# Patient Record
Sex: Female | Born: 1945
Health system: Southern US, Community
[De-identification: ages and names within clinical notes are randomized; demographics above are authoritative.]

## PROBLEM LIST (undated history)

## (undated) DIAGNOSIS — E785 Hyperlipidemia, unspecified: Secondary | ICD-10-CM

## (undated) HISTORY — DX: Hyperlipidemia, unspecified: E78.5

## (undated) HISTORY — PX: INCONTINENCE SURGERY: SHX676

## (undated) HISTORY — PX: BREAST CYST EXCISION: SHX579

## (undated) HISTORY — PX: ABDOMINAL HYSTERECTOMY: SHX81

## (undated) HISTORY — PX: OVARIAN CYST REMOVAL: SHX89

## (undated) HISTORY — PX: APPENDECTOMY: SHX54

---

## 1998-05-16 ENCOUNTER — Other Ambulatory Visit: Admission: RE | Admit: 1998-05-16 | Discharge: 1998-05-16 | Payer: Self-pay | Admitting: Obstetrics and Gynecology

## 1998-05-20 ENCOUNTER — Ambulatory Visit (HOSPITAL_BASED_OUTPATIENT_CLINIC_OR_DEPARTMENT_OTHER): Admission: RE | Admit: 1998-05-20 | Discharge: 1998-05-20 | Payer: Self-pay

## 1998-11-03 ENCOUNTER — Other Ambulatory Visit: Admission: RE | Admit: 1998-11-03 | Discharge: 1998-11-03 | Payer: Self-pay | Admitting: Obstetrics and Gynecology

## 2000-09-27 ENCOUNTER — Encounter: Payer: Self-pay | Admitting: Obstetrics and Gynecology

## 2000-09-27 ENCOUNTER — Encounter: Admission: RE | Admit: 2000-09-27 | Discharge: 2000-09-27 | Payer: Self-pay | Admitting: Obstetrics and Gynecology

## 2001-04-30 ENCOUNTER — Ambulatory Visit (HOSPITAL_COMMUNITY): Admission: RE | Admit: 2001-04-30 | Discharge: 2001-04-30 | Payer: Self-pay | Admitting: Gastroenterology

## 2001-05-23 ENCOUNTER — Other Ambulatory Visit: Admission: RE | Admit: 2001-05-23 | Discharge: 2001-05-23 | Payer: Self-pay | Admitting: Obstetrics and Gynecology

## 2002-10-01 ENCOUNTER — Other Ambulatory Visit: Admission: RE | Admit: 2002-10-01 | Discharge: 2002-10-01 | Payer: Self-pay | Admitting: Obstetrics and Gynecology

## 2003-10-05 ENCOUNTER — Encounter: Admission: RE | Admit: 2003-10-05 | Discharge: 2003-10-05 | Payer: Self-pay | Admitting: Obstetrics and Gynecology

## 2003-10-05 ENCOUNTER — Encounter: Payer: Self-pay | Admitting: Obstetrics and Gynecology

## 2004-01-06 ENCOUNTER — Other Ambulatory Visit: Admission: RE | Admit: 2004-01-06 | Discharge: 2004-01-06 | Payer: Self-pay | Admitting: Obstetrics and Gynecology

## 2004-11-14 ENCOUNTER — Encounter: Admission: RE | Admit: 2004-11-14 | Discharge: 2004-11-14 | Payer: Self-pay | Admitting: Obstetrics and Gynecology

## 2005-02-23 ENCOUNTER — Other Ambulatory Visit: Admission: RE | Admit: 2005-02-23 | Discharge: 2005-02-23 | Payer: Self-pay | Admitting: Obstetrics and Gynecology

## 2005-10-04 ENCOUNTER — Ambulatory Visit: Payer: Self-pay | Admitting: Internal Medicine

## 2007-02-26 ENCOUNTER — Ambulatory Visit: Payer: Self-pay | Admitting: Internal Medicine

## 2007-02-26 LAB — CONVERTED CEMR LAB
ALT: 18 units/L (ref 0–40)
AST: 19 units/L (ref 0–37)
Albumin: 3.9 g/dL (ref 3.5–5.2)
Alkaline Phosphatase: 61 units/L (ref 39–117)
BUN: 14 mg/dL (ref 6–23)
Bacteria, UA: NEGATIVE
Basophils Absolute: 0.1 10*3/uL (ref 0.0–0.1)
Basophils Relative: 1.9 % — ABNORMAL HIGH (ref 0.0–1.0)
Bilirubin Urine: NEGATIVE
Bilirubin, Direct: 0.1 mg/dL (ref 0.0–0.3)
CO2: 29 meq/L (ref 19–32)
Calcium: 9.1 mg/dL (ref 8.4–10.5)
Chloride: 110 meq/L (ref 96–112)
Cholesterol: 232 mg/dL (ref 0–200)
Creatinine, Ser: 0.8 mg/dL (ref 0.4–1.2)
Crystals: NEGATIVE
Direct LDL: 151 mg/dL
Eosinophils Absolute: 0 10*3/uL (ref 0.0–0.6)
Eosinophils Relative: 0.5 % (ref 0.0–5.0)
GFR calc Af Amer: 95 mL/min
GFR calc non Af Amer: 78 mL/min
Glucose, Bld: 98 mg/dL (ref 70–99)
HCT: 41.6 % (ref 36.0–46.0)
HDL: 58.9 mg/dL (ref >39.0)
Hemoglobin, Urine: NEGATIVE
Hemoglobin: 14.3 g/dL (ref 12.0–15.0)
Ketones, ur: NEGATIVE mg/dL
Lymphocytes Relative: 41.4 % (ref 12.0–46.0)
MCHC: 34.3 g/dL (ref 30.0–36.0)
MCV: 90.2 fL (ref 78.0–100.0)
Monocytes Absolute: 0.3 10*3/uL (ref 0.2–0.7)
Monocytes Relative: 5.3 % (ref 3.0–11.0)
Neutro Abs: 2.4 10*3/uL (ref 1.4–7.7)
Neutrophils Relative %: 50.9 % (ref 43.0–77.0)
Nitrite: NEGATIVE
Platelets: 272 10*3/uL (ref 150–400)
Potassium: 4.4 meq/L (ref 3.5–5.1)
RBC: 4.61 M/uL (ref 3.87–5.11)
RDW: 12.2 % (ref 11.5–14.6)
Sodium: 145 meq/L (ref 135–145)
Specific Gravity, Urine: >=1.03 (ref 1.000–1.03)
TSH: 0.84 microintl units/mL (ref 0.35–5.50)
Total Bilirubin: 0.9 mg/dL (ref 0.3–1.2)
Total CHOL/HDL Ratio: 3.9
Total Protein, Urine: NEGATIVE mg/dL
Total Protein: 6.8 g/dL (ref 6.0–8.3)
Triglycerides: 66 mg/dL (ref 0–149)
Urine Glucose: NEGATIVE mg/dL
Urobilinogen, UA: 0.2 (ref 0.0–1.0)
VLDL: 13 mg/dL (ref 0–40)
WBC: 4.8 10*3/uL (ref 4.5–10.5)
pH: 5.5 (ref 5.0–8.0)

## 2007-03-03 ENCOUNTER — Ambulatory Visit: Payer: Self-pay | Admitting: Internal Medicine

## 2008-05-10 ENCOUNTER — Ambulatory Visit: Payer: Self-pay | Admitting: Internal Medicine

## 2008-05-10 DIAGNOSIS — M25519 Pain in unspecified shoulder: Secondary | ICD-10-CM | POA: Insufficient documentation

## 2008-05-10 DIAGNOSIS — G4700 Insomnia, unspecified: Secondary | ICD-10-CM | POA: Insufficient documentation

## 2008-11-24 ENCOUNTER — Encounter: Payer: Self-pay | Admitting: Internal Medicine

## 2008-12-01 ENCOUNTER — Ambulatory Visit: Payer: Self-pay | Admitting: Internal Medicine

## 2008-12-01 DIAGNOSIS — Z860100 Personal history of colon polyps, unspecified: Secondary | ICD-10-CM | POA: Insufficient documentation

## 2008-12-01 DIAGNOSIS — E559 Vitamin D deficiency, unspecified: Secondary | ICD-10-CM | POA: Insufficient documentation

## 2008-12-03 DIAGNOSIS — E785 Hyperlipidemia, unspecified: Secondary | ICD-10-CM | POA: Insufficient documentation

## 2008-12-03 LAB — CONVERTED CEMR LAB: Vit D, 1,25-Dihydroxy: 9 — ABNORMAL LOW (ref 30–89)

## 2008-12-06 LAB — CONVERTED CEMR LAB
ALT: 19 units/L (ref 0–35)
AST: 21 units/L (ref 0–37)
Albumin: 3.9 g/dL (ref 3.5–5.2)
Alkaline Phosphatase: 70 units/L (ref 39–117)
BUN: 14 mg/dL (ref 6–23)
Basophils Absolute: 0 10*3/uL (ref 0.0–0.1)
Basophils Relative: 0.7 % (ref 0.0–3.0)
Bilirubin Urine: NEGATIVE
Bilirubin, Direct: 0.1 mg/dL (ref 0.0–0.3)
CO2: 30 meq/L (ref 19–32)
Calcium: 9.2 mg/dL (ref 8.4–10.5)
Chloride: 108 meq/L (ref 96–112)
Cholesterol: 256 mg/dL (ref 0–200)
Creatinine, Ser: 0.8 mg/dL (ref 0.4–1.2)
Direct LDL: 181.1 mg/dL
Eosinophils Absolute: 0 10*3/uL (ref 0.0–0.7)
Eosinophils Relative: 0.8 % (ref 0.0–5.0)
GFR calc Af Amer: 94 mL/min
GFR calc non Af Amer: 78 mL/min
Glucose, Bld: 95 mg/dL (ref 70–99)
HCT: 42.4 % (ref 36.0–46.0)
HDL: 59.6 mg/dL (ref >39.0)
Hemoglobin, Urine: NEGATIVE
Hemoglobin: 14.5 g/dL (ref 12.0–15.0)
Ketones, ur: NEGATIVE mg/dL
Leukocytes, UA: NEGATIVE
Lymphocytes Relative: 36 % (ref 12.0–46.0)
MCHC: 34.3 g/dL (ref 30.0–36.0)
MCV: 88.7 fL (ref 78.0–100.0)
Monocytes Absolute: 0.4 10*3/uL (ref 0.1–1.0)
Monocytes Relative: 7.4 % (ref 3.0–12.0)
Neutro Abs: 2.8 10*3/uL (ref 1.4–7.7)
Neutrophils Relative %: 55.1 % (ref 43.0–77.0)
Nitrite: NEGATIVE
Platelets: 231 10*3/uL (ref 150–400)
Potassium: 4.2 meq/L (ref 3.5–5.1)
RBC: 4.79 M/uL (ref 3.87–5.11)
RDW: 11.9 % (ref 11.5–14.6)
Sodium: 143 meq/L (ref 135–145)
Specific Gravity, Urine: <=1.005 (ref 1.000–1.03)
TSH: 0.88 microintl units/mL (ref 0.35–5.50)
Total Bilirubin: 1 mg/dL (ref 0.3–1.2)
Total CHOL/HDL Ratio: 4.3
Total Protein, Urine: NEGATIVE mg/dL
Total Protein: 7.3 g/dL (ref 6.0–8.3)
Triglycerides: 66 mg/dL (ref 0–149)
Urine Glucose: NEGATIVE mg/dL
Urobilinogen, UA: 1 (ref 0.0–1.0)
VLDL: 13 mg/dL (ref 0–40)
WBC: 5 10*3/uL (ref 4.5–10.5)
pH: 6 (ref 5.0–8.0)

## 2009-01-14 ENCOUNTER — Ambulatory Visit: Payer: Self-pay | Admitting: Internal Medicine

## 2009-01-14 DIAGNOSIS — H6690 Otitis media, unspecified, unspecified ear: Secondary | ICD-10-CM | POA: Insufficient documentation

## 2009-01-17 ENCOUNTER — Telehealth (INDEPENDENT_AMBULATORY_CARE_PROVIDER_SITE_OTHER): Payer: Self-pay | Admitting: *Deleted

## 2009-05-27 ENCOUNTER — Telehealth: Payer: Self-pay | Admitting: Internal Medicine

## 2009-09-27 ENCOUNTER — Ambulatory Visit: Payer: Self-pay | Admitting: Internal Medicine

## 2009-09-27 DIAGNOSIS — T7020XA Unspecified effects of high altitude, initial encounter: Secondary | ICD-10-CM | POA: Insufficient documentation

## 2010-04-06 ENCOUNTER — Telehealth (INDEPENDENT_AMBULATORY_CARE_PROVIDER_SITE_OTHER): Payer: Self-pay | Admitting: *Deleted

## 2010-11-20 ENCOUNTER — Telehealth: Payer: Self-pay | Admitting: Internal Medicine

## 2010-11-29 ENCOUNTER — Encounter (INDEPENDENT_AMBULATORY_CARE_PROVIDER_SITE_OTHER): Payer: Self-pay | Admitting: *Deleted

## 2010-11-29 ENCOUNTER — Telehealth: Payer: Self-pay | Admitting: Internal Medicine

## 2011-01-25 NOTE — Letter (Signed)
Summary: Primary Care Appointment Letter  [facility]  [address] [place], [place] [address]   Phone: [phone]  Fax: [phone]    11/29/2010 MRN: [id number]  Olivia Odom [address] [place], [place]  [address]  Dear [name],   Your Primary Care Physician Tresa Garter MD has indicated that:    ___XX____it is time to schedule an appointment for follow up.    _______you missed your appointment on______ and need to call and          reschedule.    _______you need to have lab work done.    _______you need to schedule an appointment discuss lab or test results.    _______you need to call to reschedule your appointment that is                       scheduled on _________.     Please call our office as soon as possible. Our phone number is [phone] option #1. Please press option 1. Our office is open 8a-12noon and 1p-5p, Monday through Friday.     Thank you,    [facility]

## 2011-01-25 NOTE — Progress Notes (Signed)
Summary: Rf Ambien  Phone Note Refill Request Message from:  Fax from Pharmacy  Refills Requested: Medication #1:  ZOLPIDEM TARTRATE 10 MG  TABS 1/2 or 1 by mouth at hs prn   Dosage confirmed as above?Dosage Confirmed   Supply Requested: 90   Last Refilled: 09/17/2010  Method Requested: Telephone to Pharmacy Next Appointment Scheduled: none Initial call taken by: Lanier Prude, [facility]),  November 20, 2010 10:55 AM  Follow-up for Phone Call        ok 3 ref Follow-up by: Tresa Garter MD,  November 20, 2010 5:51 PM    Prescriptions: ZOLPIDEM TARTRATE 10 MG  TABS (ZOLPIDEM TARTRATE) 1/2 or 1 by mouth at hs prn  #90 x 1   Entered by:   Lamar Sprinkles, CMA   Authorized by:   Tresa Garter MD   Signed by:   Lamar Sprinkles, CMA on 11/20/2010   Method used:   Telephoned to ...       [facility] [address] [phone]* (retail)       [address]       [place], [place]  [address]       Ph: [phone] or [phone]       Fax: [phone]   RxID:   [id number]

## 2011-01-25 NOTE — Progress Notes (Signed)
Summary: Zolpidem refill  Phone Note Refill Request Message from:  Fax from Pharmacy on April 06, 2010 11:28 AM  Refills Requested: Medication #1:  ZOLPIDEM TARTRATE 10 MG  TABS 1/2 or 1 by mouth at hs prn Next Appointment Scheduled: none Initial call taken by: Lucious Groves,  April 06, 2010 11:28 AM  Follow-up for Phone Call        ok x 6    Prescriptions: ZOLPIDEM TARTRATE 10 MG  TABS (ZOLPIDEM TARTRATE) 1/2 or 1 by mouth at hs prn  #90 x 6   Entered by:   Ami Bullins CMA   Authorized by:   Tresa Garter MD   Signed by:   Bill Salinas CMA on 04/06/2010   Method used:   Telephoned to ...       [facility] [address] [phone]* (retail)       [address]       [place], [place]  [address]       Ph: [phone] or [phone]       Fax: [phone]   RxID:   [id number]

## 2011-01-25 NOTE — Progress Notes (Signed)
Summary: Needs OV with PCP  ---- Converted from flag ---- ---- 11/29/2010 10:46 AM, Verdell Face wrote: mailed a letter after calls..cd    ---- 11/20/2010 10:56 AM, Lanier Prude, CMA(AAMA) wrote: Please sched pt OV with PCP. Last seen 09/2009.  Thanks! ------------------------------

## 2011-05-11 NOTE — Assessment & Plan Note (Signed)
[facility]                           PRIMARY CARE OFFICE NOTE   NAME:Olivia Odom, Olivia Odom                      MRN:          [id number]  DATE:03/03/2007                            DOB:          [birth year]    The patient, a 65 year old female, presents for wellness examination.   ALLERGIES:  None.   Past medical history, family history, and social history as per  August 30, 2004.   CURRENT MEDICINES:  None.   REVIEW OF SYSTEMS:  There is no chest pain or shortness of breath, no  abdominal complaints.  The rest of the review of systems is negative.   PHYSICAL EXAMINATION:  VITAL SIGNS:  Blood pressure 118/74, pulse 68,  temp 98.6, weight 241 pounds.  GENERAL:  She looks well, younger than her stated age.  HEENT:  Moist mucosa.  Wax impaction bilaterally.  NECK:  Supple, no thyromegaly or bruit.  LUNGS: Clear to auscultation and percussion, no wheeze or rales.  HEART:  S1 and S2, no murmur, no gallop.  ABDOMEN:  Soft and nontender, no organomegaly is felt.  EXTREMITIES:  Lower extremities without edema.  NEUROLOGIC:  She is alert and cooperative, not depressed.  SKIN:  Clear.   LABORATORY DATA:  On February 26, 2007:  WBC normal, CMET normal,  cholesterol 232, LDL 151, HDL 58.9, TSH 0.84, urinalysis normal.   ASSESSMENT AND PLAN:  1. Normal wellness examination.  Age/health related issues discussed,      her cholesterol discussed, continue regular joint care with Dr.      Tenny Craw, obtain bone density scan, and advised to take calcium and      vitamin D.  2. Left ear - dermatitis.  Triamcinolone 0.5% cream to use daily      p.r.n.  3. Wax impaction bilaterally.  Will irrigate.   PROCEDURE:  Ear irrigation.   INDICATION:  Wax impaction.  The risks and benefits explained to  patient.  Both ears were irrigated with lukewarm water.  A large amount  of wax recovered from both sides.  Tolerated well.   COMPLICATIONS:  None.     Georgina Quint.  Plotnikov, MD  Electronically Signed    AVP/MedQ  DD: 03/06/2007  DT: 03/07/2007  Job #: [id number]   cc:   Miguel Aschoff, M.D.

## 2011-05-28 ENCOUNTER — Ambulatory Visit (INDEPENDENT_AMBULATORY_CARE_PROVIDER_SITE_OTHER): Payer: BC Managed Care – PPO | Admitting: Internal Medicine

## 2011-05-28 ENCOUNTER — Encounter: Payer: Self-pay | Admitting: Internal Medicine

## 2011-05-28 VITALS — BP 130/78 | HR 80 | Temp 98.2°F | Resp 16 | Ht 62.0 in | Wt 135.0 lb

## 2011-05-28 DIAGNOSIS — Z23 Encounter for immunization: Secondary | ICD-10-CM

## 2011-05-28 DIAGNOSIS — Z0000 Encounter for general adult medical examination without abnormal findings: Secondary | ICD-10-CM | POA: Insufficient documentation

## 2011-05-28 DIAGNOSIS — G4700 Insomnia, unspecified: Secondary | ICD-10-CM

## 2011-05-28 DIAGNOSIS — H109 Unspecified conjunctivitis: Secondary | ICD-10-CM | POA: Insufficient documentation

## 2011-05-28 DIAGNOSIS — T7020XA Unspecified effects of high altitude, initial encounter: Secondary | ICD-10-CM

## 2011-05-28 DIAGNOSIS — H10029 Other mucopurulent conjunctivitis, unspecified eye: Secondary | ICD-10-CM

## 2011-05-28 MED ORDER — TETANUS-DIPHTH-ACELL PERTUSSIS 5-2.5-18.5 LF-MCG/0.5 IM SUSP
0.5000 mL | Freq: Once | INTRAMUSCULAR | Status: AC
  Administered 2011-05-28: 0.5 mL via INTRAMUSCULAR

## 2011-05-28 MED ORDER — DEXAMETHASONE 4 MG PO TABS: ORAL_TABLET | ORAL | Status: DC

## 2011-05-28 MED ORDER — SULFACETAMIDE-PREDNISOLONE 10-0.23 % OP SOLN: 1.0000 [drp] | Freq: Four times a day (QID) | OPHTHALMIC | Status: AC

## 2011-05-28 MED ORDER — OSELTAMIVIR PHOSPHATE 75 MG PO CAPS: 75.0000 mg | ORAL_CAPSULE | Freq: Two times a day (BID) | ORAL | Status: DC

## 2011-05-28 MED ORDER — TOBRAMYCIN-DEXAMETHASONE 0.3-0.1 % OP SUSP: 1.0000 [drp] | Freq: Four times a day (QID) | OPHTHALMIC | Status: AC

## 2011-05-28 MED ORDER — ZOLPIDEM TARTRATE 10 MG PO TABS: 5.0000 mg | ORAL_TABLET | Freq: Every evening | ORAL | Status: DC | PRN

## 2011-05-28 NOTE — Progress Notes (Signed)
  Subjective:    Patient ID: Olivia Odom, female    DOB: 10/29/1946, 65 y.o.   MRN: [id number]  HPI   The patient is here for a wellness exam. The patient has been doing well overall without major physical or psychological issues going on lately.  C/o L eye - pink x 2 d after a visit to [employer]   Review of Systems  Constitutional: Negative.  Negative for fever, chills, diaphoresis, activity change, appetite change, fatigue and unexpected weight change.  HENT: Negative for hearing loss, ear pain, nosebleeds, congestion, sore throat, facial swelling, rhinorrhea, sneezing, mouth sores, trouble swallowing, neck pain, neck stiffness, postnasal drip, sinus pressure and tinnitus.   Eyes: Positive for pain, discharge, itching and visual disturbance. Negative for redness (L eye).  Respiratory: Negative for cough, chest tightness, shortness of breath, wheezing and stridor.   Cardiovascular: Negative for chest pain, palpitations and leg swelling.  Gastrointestinal: Negative for nausea, diarrhea, constipation, blood in stool, abdominal distention, anal bleeding and rectal pain.  Genitourinary: Negative for dysuria, urgency, frequency, hematuria, flank pain, vaginal bleeding, vaginal discharge, difficulty urinating, genital sores, menstrual problem and pelvic pain.  Musculoskeletal: Negative for back pain, joint swelling, arthralgias and gait problem.  Skin: Negative.  Negative for rash.  Neurological: Negative for dizziness, tremors, seizures, syncope, speech difficulty, weakness, numbness and headaches.  Hematological: Negative for adenopathy. Does not bruise/bleed easily.  Psychiatric/Behavioral: Negative for suicidal ideas, behavioral problems, sleep disturbance, dysphoric mood and decreased concentration. The patient is not nervous/anxious.        Objective:   Physical Exam  Constitutional: She appears well-developed and well-nourished. No distress.  HENT:  Head: Normocephalic.    Right Ear: External ear normal.  Left Ear: External ear normal.  Nose: Nose normal.  Mouth/Throat: Oropharynx is clear and moist.  Eyes: Pupils are equal, round, and reactive to light. Right eye exhibits no discharge. Left eye exhibits discharge.       L conj w/erythema. No ulceratiom. Nl pressure by palpation  Neck: Normal range of motion. Neck supple. No JVD present. No tracheal deviation present. No thyromegaly present.  Cardiovascular: Normal rate, regular rhythm and normal heart sounds.   Pulmonary/Chest: No stridor. No respiratory distress. She has no wheezes.  Abdominal: Soft. Bowel sounds are normal. She exhibits no distension and no mass. There is no tenderness. There is no rebound and no guarding.  Musculoskeletal: She exhibits no edema and no tenderness.  Lymphadenopathy:    She has no cervical adenopathy.  Neurological: She displays normal reflexes. No cranial nerve deficit. She exhibits normal muscle tone. Coordination normal.  Skin: No rash noted. She is not diaphoretic. No erythema.  Psychiatric: She has a normal mood and affect. Her behavior is normal. Judgment and thought content normal.   L eye       Assessment & Plan:

## 2011-05-28 NOTE — Assessment & Plan Note (Signed)
See Rx OV w/Dr Nile Riggs if not better in 2-3 d

## 2011-05-28 NOTE — Assessment & Plan Note (Signed)
On rx prn

## 2011-05-28 NOTE — Progress Notes (Signed)
Addended by: Vernie Murders on: 05/28/2011 12:25 PM   Modules accepted: Orders

## 2011-05-28 NOTE — Assessment & Plan Note (Addendum)
Will try Dexamethasone prn Info given  Potential benefits of a short term steroid  use as well as potential risks  and complications were explained to the patient and were aknowledged.

## 2011-05-28 NOTE — Progress Notes (Signed)
Addended by: Merrilyn Puma on: 05/28/2011 04:58 PM   Modules accepted: Orders

## 2011-05-28 NOTE — Assessment & Plan Note (Signed)
We discussed age appropriate health related issues, including available/recomended screening tests and vaccinations. We discussed a need for adhering to healthy diet and exercise. Labs were ordered. All questions were answered. GYN/pap/mammo/BDS - per Dr Tenny Craw

## 2011-06-01 ENCOUNTER — Other Ambulatory Visit: Payer: Self-pay

## 2011-06-01 ENCOUNTER — Other Ambulatory Visit: Payer: Self-pay | Admitting: Internal Medicine

## 2011-06-01 DIAGNOSIS — Z0000 Encounter for general adult medical examination without abnormal findings: Secondary | ICD-10-CM

## 2011-06-04 ENCOUNTER — Ambulatory Visit: Payer: Self-pay

## 2011-06-04 ENCOUNTER — Encounter: Payer: Self-pay | Admitting: Internal Medicine

## 2011-06-05 ENCOUNTER — Other Ambulatory Visit: Payer: Self-pay | Admitting: Internal Medicine

## 2011-06-05 ENCOUNTER — Other Ambulatory Visit (INDEPENDENT_AMBULATORY_CARE_PROVIDER_SITE_OTHER): Payer: BC Managed Care – PPO | Admitting: Internal Medicine

## 2011-06-05 ENCOUNTER — Other Ambulatory Visit (INDEPENDENT_AMBULATORY_CARE_PROVIDER_SITE_OTHER): Payer: BC Managed Care – PPO

## 2011-06-05 DIAGNOSIS — Z0000 Encounter for general adult medical examination without abnormal findings: Secondary | ICD-10-CM

## 2011-06-05 LAB — CBC WITH DIFFERENTIAL/PLATELET
Basophils Relative: 0.7 % (ref 0.0–3.0)
Eosinophils Absolute: 0 10*3/uL (ref 0.0–0.7)
HCT: 41.5 % (ref 36.0–46.0)
Hemoglobin: 14.4 g/dL (ref 12.0–15.0)
Lymphocytes Relative: 40.9 % (ref 12.0–46.0)
Lymphs Abs: 2.1 10*3/uL (ref 0.7–4.0)
MCHC: 34.6 g/dL (ref 30.0–36.0)
MCV: 92.6 fl (ref 78.0–100.0)
Neutro Abs: 2.6 10*3/uL (ref 1.4–7.7)
RBC: 4.48 Mil/uL (ref 3.87–5.11)

## 2011-06-05 LAB — BASIC METABOLIC PANEL
BUN: 16 mg/dL (ref 6–23)
CO2: 27 mEq/L (ref 19–32)
Chloride: 108 mEq/L (ref 96–112)
Creatinine, Ser: 0.9 mg/dL (ref 0.4–1.2)

## 2011-06-05 LAB — HEPATIC FUNCTION PANEL
AST: 21 U/L (ref 0–37)
Albumin: 4.3 g/dL (ref 3.5–5.2)
Alkaline Phosphatase: 72 U/L (ref 39–117)
Bilirubin, Direct: 0.1 mg/dL (ref 0.0–0.3)
Total Bilirubin: 0.6 mg/dL (ref 0.3–1.2)

## 2011-06-05 LAB — URINALYSIS, ROUTINE W REFLEX MICROSCOPIC
Nitrite: NEGATIVE
Specific Gravity, Urine: >=1.03 (ref 1.000–1.030)
Urobilinogen, UA: 0.2 (ref 0.0–1.0)
pH: 5.5 (ref 5.0–8.0)

## 2011-06-05 LAB — LIPID PANEL
Total CHOL/HDL Ratio: 3
VLDL: 11.2 mg/dL (ref 0.0–40.0)

## 2011-06-06 ENCOUNTER — Telehealth: Payer: Self-pay | Admitting: Internal Medicine

## 2011-06-06 DIAGNOSIS — E785 Hyperlipidemia, unspecified: Secondary | ICD-10-CM

## 2011-06-06 NOTE — Telephone Encounter (Signed)
Olivia Odom, please, inform patient that all labs are normal except for elev chol  Please, mail the labs to the patient.

## 2011-06-07 NOTE — Telephone Encounter (Signed)
Pt informed. Copies mailed.  She wants to know what to do for elevated chol. Please advise

## 2011-06-08 NOTE — Telephone Encounter (Signed)
We can start chol lowering med with a marginal risk reduction of heart disease.  She can take a  Baby asa a day. I would eat well, exercise, loose 4 lbs ( a soft call) and try to take fish oil. We can recheck lipids in 6 mo and ROV w/results Thx

## 2011-06-11 ENCOUNTER — Telehealth: Payer: Self-pay | Admitting: Internal Medicine

## 2011-06-11 NOTE — Telephone Encounter (Signed)
Pt informed

## 2011-06-11 NOTE — Telephone Encounter (Signed)
Sonda Primes, MD 06/08/2011 5:45 PM Signed  We can start chol lowering med with a marginal risk reduction of heart disease.  She can take a Baby asa a day.  I would eat well, exercise, loose 4 lbs ( a soft call) and try to take fish oil.  We can recheck lipids in 6 mo and ROV w/results  Thx

## 2011-06-11 NOTE — Telephone Encounter (Signed)
Pt informed. She will let us know about starting chol med. And f/u in 6 mo

## 2012-01-09 ENCOUNTER — Ambulatory Visit: Payer: BC Managed Care – PPO

## 2012-01-09 DIAGNOSIS — E785 Hyperlipidemia, unspecified: Secondary | ICD-10-CM

## 2012-01-09 LAB — LIPID PANEL
Total CHOL/HDL Ratio: 4
Triglycerides: 85 mg/dL (ref 0.0–149.0)

## 2012-01-09 LAB — LDL CHOLESTEROL, DIRECT: Direct LDL: 160.7 mg/dL

## 2012-01-10 ENCOUNTER — Telehealth: Payer: Self-pay | Admitting: *Deleted

## 2012-01-10 NOTE — Telephone Encounter (Signed)
Rf req for Zolpidem 10 mg 1/2-1 po qhs. Ok to Rf? 

## 2012-01-14 ENCOUNTER — Telehealth: Payer: Self-pay

## 2012-01-14 DIAGNOSIS — Z0000 Encounter for general adult medical examination without abnormal findings: Secondary | ICD-10-CM

## 2012-01-14 MED ORDER — ZOLPIDEM TARTRATE 10 MG PO TABS: 5.0000 mg | ORAL_TABLET | Freq: Every evening | ORAL | Status: DC | PRN

## 2012-01-14 NOTE — Telephone Encounter (Signed)
Patient would like recent lab results. Please call her cell number at [phone]

## 2012-01-14 NOTE — Telephone Encounter (Signed)
Please advise in AVP's absence. Thanks!

## 2012-01-14 NOTE — Telephone Encounter (Signed)
sure

## 2012-01-15 ENCOUNTER — Encounter: Payer: Self-pay | Admitting: Endocrinology

## 2012-01-15 ENCOUNTER — Ambulatory Visit (INDEPENDENT_AMBULATORY_CARE_PROVIDER_SITE_OTHER): Payer: BC Managed Care – PPO | Admitting: Endocrinology

## 2012-01-15 VITALS — BP 136/92 | HR 86 | Temp 97.9°F | Ht 62.0 in | Wt 140.0 lb

## 2012-01-15 DIAGNOSIS — J069 Acute upper respiratory infection, unspecified: Secondary | ICD-10-CM

## 2012-01-15 MED ORDER — AZITHROMYCIN 500 MG PO TABS: 500.0000 mg | ORAL_TABLET | Freq: Every day | ORAL | Status: AC

## 2012-01-15 MED ORDER — AZITHROMYCIN 500 MG PO TABS: 500.0000 mg | ORAL_TABLET | Freq: Every day | ORAL | Status: DC

## 2012-01-15 NOTE — Progress Notes (Signed)
  Subjective:    Patient ID: Olivia Odom, female    DOB: 1946-02-01, 66 y.o.   MRN: [id number]  HPI Pt states 2 weeks of slight right otalgia, and assoc sore throat No past medical history on file.  Past Surgical History  Procedure Date  . Breast cyst excision     college    History   Social History  . Marital Status: Married    Spouse Name: N/A    Number of Children: N/A  . Years of Education: N/A   Occupational History  . Not on file.   Social History Main Topics  . Smoking status: Never Smoker   . Smokeless tobacco: Not on file  . Alcohol Use: 0.6 oz/week    1 Glasses of wine per week  . Drug Use: No  . Sexually Active: Yes   Other Topics Concern  . Not on file   Social History Narrative  . No narrative on file    Current Outpatient Prescriptions on File Prior to Visit  Medication Sig Dispense Refill  . cholecalciferol (VITAMIN D) 1000 UNITS tablet Take 1,000 Units by mouth daily.        Marland Kitchen dexamethasone (DECADRON) 4 MG tablet 1 po qd 1 day prior to ascend and qd pc x 2-3 days  21 tablet  0  . Omega-3 Fatty Acids (FISH OIL) 1000 MG CAPS Take 1 each by mouth 2 (two) times daily.        Marland Kitchen zolpidem (AMBIEN) 10 MG tablet Take 0.5-1 tablets (5-10 mg total) by mouth at bedtime as needed.  30 tablet  0    No Known Allergies  Family History  Problem Relation Age of Onset  . Cancer Mother 14    breast cancer  . Heart disease Father 28    MI    BP 136/92  Pulse 86  Temp(Src) 97.9 F (36.6 C) (Oral)  Ht 5\' 2"  (1.575 m)  Wt 140 lb (63.504 kg)  BMI 25.61 kg/m2  SpO2 97%    Review of Systems She has nasal congestion, but no fever.  No cough    Objective:   Physical Exam VITAL SIGNS:  See vs page GENERAL: no distress head: no deformity eyes: no periorbital swelling, no proptosis external nose and ears are normal mouth: no lesion seen Right tm is red.  Left is normal NECK: There is no palpable thyroid enlargement.  No thyroid nodule is palpable.   No palpable lymphadenopathy at the anterior neck. LUNGS:  Clear to auscultation         Assessment & Plan:

## 2012-01-15 NOTE — Patient Instructions (Addendum)
Here is a prescription, for an antibiotic.   Loratadine-d (non-prescription) will help your congestion.  I hope you feel better soon.

## 2012-01-17 NOTE — Telephone Encounter (Signed)
Please advise- does pt need to change anything re: abnormal lipids?

## 2012-01-18 NOTE — Telephone Encounter (Signed)
Chol is better than before: good cholesterol is very high. Keep ROV or sch well exam w/labs in 6 mo Cont fish oil, good diet  Please, mail the labs to the patient.    Thx

## 2012-01-18 NOTE — Telephone Encounter (Signed)
Pt informed/transferred to scheduler. CPE labs entered.

## 2012-01-20 NOTE — Progress Notes (Signed)
  Subjective:    Patient ID: Olivia Odom, female    DOB: 1946-10-21, 66 y.o.   MRN: [id number]  HPI    Review of Systems     Objective:   Physical Exam        Assessment & Plan:  URI, new

## 2012-03-28 ENCOUNTER — Telehealth: Payer: Self-pay | Admitting: *Deleted

## 2012-03-28 NOTE — Telephone Encounter (Signed)
Rf req for Zolpidem 10 mg 1/2-1 po qhs. Ok to Rf? 

## 2012-03-30 NOTE — Telephone Encounter (Signed)
OK to fill this prescription with additional refills x5 Thank you!  

## 2012-03-31 MED ORDER — ZOLPIDEM TARTRATE 10 MG PO TABS: 5.0000 mg | ORAL_TABLET | Freq: Every evening | ORAL | Status: DC | PRN

## 2012-03-31 NOTE — Telephone Encounter (Signed)
Done

## 2012-07-02 ENCOUNTER — Encounter: Payer: BC Managed Care – PPO | Admitting: Internal Medicine

## 2012-08-11 ENCOUNTER — Encounter: Payer: Self-pay | Admitting: Internal Medicine

## 2012-08-11 ENCOUNTER — Ambulatory Visit (INDEPENDENT_AMBULATORY_CARE_PROVIDER_SITE_OTHER): Payer: BC Managed Care – PPO | Admitting: Internal Medicine

## 2012-08-11 VITALS — BP 130/88 | HR 69 | Temp 97.5°F | Resp 16 | Wt 135.0 lb

## 2012-08-11 DIAGNOSIS — T783XXA Angioneurotic edema, initial encounter: Secondary | ICD-10-CM

## 2012-08-11 MED ORDER — DOXEPIN HCL 10 MG PO CAPS: 10.0000 mg | ORAL_CAPSULE | Freq: Every day | ORAL | Status: DC

## 2012-08-11 NOTE — Progress Notes (Signed)
Subjective:    Patient ID: Olivia Odom, female    DOB: 08/27/1946, 66 y.o.   MRN: [id number]  Rash This is a recurrent problem. The current episode started in the past 7 days. The problem has been gradually improving since onset. The affected locations include the lips. The rash is characterized by itchiness and swelling. She was exposed to nothing. Associated symptoms include a sore throat. Pertinent negatives include no anorexia, congestion, cough, diarrhea, eye pain, facial edema, fatigue, fever, joint pain, nail changes, rhinorrhea, shortness of breath or vomiting. Past treatments include nothing.      Review of Systems  Constitutional: Negative for fever, chills, diaphoresis, activity change, appetite change, fatigue and unexpected weight change.  HENT: Positive for sore throat. Negative for hearing loss, ear pain, nosebleeds, congestion, facial swelling, rhinorrhea, sneezing, drooling, mouth sores, trouble swallowing, neck pain, neck stiffness, dental problem, voice change, postnasal drip, sinus pressure, tinnitus and ear discharge.   Eyes: Negative.  Negative for pain.  Respiratory: Negative for cough, chest tightness, shortness of breath, wheezing and stridor.   Cardiovascular: Negative for chest pain, palpitations and leg swelling.  Gastrointestinal: Negative.  Negative for vomiting, diarrhea and anorexia.  Genitourinary: Negative.   Musculoskeletal: Negative for joint pain.  Skin: Positive for rash. Negative for nail changes, color change, pallor and wound.  Neurological: Negative.   Hematological: Negative for adenopathy. Does not bruise/bleed easily.  Psychiatric/Behavioral: Negative.        Objective:   Physical Exam  Vitals reviewed. Constitutional: She is oriented to person, place, and time. She appears well-developed and well-nourished.  Non-toxic appearance. She does not have a sickly appearance. She does not appear ill. No distress.  HENT:  Head: Normocephalic and  atraumatic. No trismus in the jaw.    Right Ear: Hearing, tympanic membrane, external ear and ear canal normal.  Left Ear: Hearing, tympanic membrane, external ear and ear canal normal.  Mouth/Throat: Mucous membranes are normal. Mucous membranes are not pale, not dry and not cyanotic. No oral lesions. No uvula swelling. Posterior oropharyngeal erythema present. No oropharyngeal exudate, posterior oropharyngeal edema or tonsillar abscesses.       Her lips are mildly and diffusely swollen  Eyes: Conjunctivae are normal. Right eye exhibits no discharge. Left eye exhibits no discharge. No scleral icterus.  Neck: Normal range of motion. Neck supple. No JVD present. No tracheal deviation present. No thyromegaly present.  Cardiovascular: Normal rate, regular rhythm, normal heart sounds and intact distal pulses.  Exam reveals no gallop and no friction rub.   No murmur heard. Pulmonary/Chest: Effort normal and breath sounds normal. No stridor. No respiratory distress. She has no wheezes. She has no rales. She exhibits no tenderness.  Abdominal: Soft. Bowel sounds are normal. She exhibits no distension and no mass. There is no tenderness. There is no rebound and no guarding.  Musculoskeletal: Normal range of motion. She exhibits no edema and no tenderness.  Lymphadenopathy:    She has no cervical adenopathy.  Neurological: She is oriented to person, place, and time.  Skin: Skin is warm and dry. No rash noted. She is not diaphoretic. No erythema. No pallor.  Psychiatric: She has a normal mood and affect. Her behavior is normal. Judgment and thought content normal.      Lab Results  Component Value Date   WBC 5.1 06/05/2011   HGB 14.4 06/05/2011   HCT 41.5 06/05/2011   PLT 239.0 06/05/2011   GLUCOSE 97 06/05/2011   CHOL 244* 01/09/2012  TRIG 85.0 01/09/2012   HDL 64.00 01/09/2012   LDLDIRECT 160.7 01/09/2012   LDLCALC DEL 12/01/2008   ALT 20 06/05/2011   AST 21 06/05/2011   NA 140 06/05/2011   K 4.5  06/05/2011   CL 108 06/05/2011   CREATININE 0.9 06/05/2011   BUN 16 06/05/2011   CO2 27 06/05/2011   TSH 0.86 06/05/2011      Assessment & Plan:

## 2012-08-11 NOTE — Patient Instructions (Signed)
Angioedema Angioedema (AE) is a sudden swelling of the eyelids, lips, lobes of ears, external genitalia, skin, and other parts of the body. AE can happen by itself. It usually begins during the night and is found on awakening. It can happen with hives and other allergic reactions. Attacks can be mild and annoying, or life-threatening if the air passages swell. AE generally occurs in a short time period (over minutes to hours) and gets better in 24 to 48 hours. It usually does not cause any serious problems.  There are 2 different kinds of AE:   Allergic AE.   Nonallergic AE.   There may be an overreaction or direct stimulation of cells that are a part of the immune system (mast cells).   There may be problems with the release of chemicals made by the body that cause swelling and inflammation (kinins). AE due to kinins can be inherited from parents (hereditary), or it can develop on its own (acquired). Acquired AE either shows up before, or along with, certain diseases or is due to the body's immune system attacking parts of the body's own cells (autoimmune).  CAUSES  Allergic  AE due to allergic reactions are caused by something that causes the body to react (trigger). Common triggers include:   Foods.   Medicines.   Latex.   Direct contact with certain fruits, vegetables, or animal saliva.   Insect stings.  Nonallergic  Mast cell stimulation may be caused by:   Medicines.   Dyes used in X-rays.   The body's own immune system reactions to parts of the body (autoimmune disease).   Possibly, some virus infections.   AE due to problems with kinins can be hereditary or acquired. Attacks are triggered by:   Mild injury.   Dental work or any surgery.   Stress.   Sudden changes in temperature.   Exercise.   Medicines.   AE due to problems with kinins can also be due to certain medicines, especially blood pressure medicines like angiotensin-converting enzyme (ACE)  inhibitors. African Americans are at nearly 5 times greater risk of developing AE than Caucasians from ACE inhibitors.  SYMPTOMS  Allergic symptoms:  Non-itchy swelling of the skin. Often the swelling is on the face and lips, but any area of the skin can swell. Sometimes, the swelling can be painful. If hives are present, there is intense itching.   Breathing problems if the air passages swell.  Nonallergic symptoms:  If internal organs are involved, there may be:   Nausea.   Abdominal pain.   Vomiting.   Difficulty swallowing.   Difficulty passing urine.   Breathing problems if the air passages swell.  Depending on the cause of AE, episodes may:  Only happen once (if triggers are removed or avoided).   Come back in unpredictable patterns.   Repeat for several years and then gradually fade away.  DIAGNOSIS  AE is diagnosed by:   Asking questions to find out how fast the symptoms began.   Taking a family history.   Physical exam.   Diagnostic tests. Tests could include:   Allergy skin tests to see if the problem is allergic.   Blood tests to diagnose hereditary and some acquired types of AE.   Other tests to see if there is a hidden disease leading to the AE.  TREATMENT  Treatment depends on the type and cause (if any) of the AE. Allergic  Allergic types of AE are treated with:   Immediate removal of   the trigger or medicine (if any).   Epinephrine injection.   Steroids.   Antihistamines.   Hospitalization for severe attacks.  Nonallergic  Mast cell stimulation types of AE are treated with:   Immediate removal of the trigger or medicine (if any).   Epinephrine injection.   Steroids.   Antihistamines.   Hospitalization for severe attacks.   Hereditary AE is treated with:   Medicines to prevent and treat attacks. There is little response to antihistamines, epinephrine, or steroids.   Preventive medicines before dental work or surgery.    Removing or avoiding medicines that trigger attacks.   Hospitalization for severe attacks.   Acquired AE is treated with:   Treating underlying disease (if any).   Medicines to prevent and treat attacks.  HOME CARE INSTRUCTIONS   Always carry your emergency allergy treatment medicines with you.   Wear a medical bracelet.   Avoid known triggers.  SEEK MEDICAL CARE IF:   You get repeat attacks.   Your attacks are more frequent or more severe despite preventive measures.   You have hereditary AE and are considering having children. It is important to discuss the risks of passing this on to your children.  SEEK IMMEDIATE MEDICAL CARE IF:   You have difficulty breathing.   You have difficulty swallowing.   You experience fainting.  This condition should be treated immediately. It can be life-threatening if it involves throat swelling. Document Released: 02/18/2002 Document Revised: 11/29/2011 Document Reviewed: 12/09/2008 Exit[employer] Patient Information 2012 [employer]. 

## 2012-08-11 NOTE — Assessment & Plan Note (Signed)
She has viral pharyngitis and angioedema of the lips, she does not want to take steroids, she will treat with doxepin at bedtime

## 2012-09-12 ENCOUNTER — Encounter: Payer: Self-pay | Admitting: Internal Medicine

## 2012-09-12 ENCOUNTER — Other Ambulatory Visit: Payer: BC Managed Care – PPO

## 2012-09-12 ENCOUNTER — Telehealth: Payer: Self-pay | Admitting: Internal Medicine

## 2012-09-12 ENCOUNTER — Ambulatory Visit (INDEPENDENT_AMBULATORY_CARE_PROVIDER_SITE_OTHER): Payer: BC Managed Care – PPO | Admitting: Internal Medicine

## 2012-09-12 VITALS — BP 130/82 | HR 84 | Temp 99.0°F | Resp 16 | Wt 135.4 lb

## 2012-09-12 DIAGNOSIS — T783XXA Angioneurotic edema, initial encounter: Secondary | ICD-10-CM

## 2012-09-12 DIAGNOSIS — L309 Dermatitis, unspecified: Secondary | ICD-10-CM

## 2012-09-12 DIAGNOSIS — L259 Unspecified contact dermatitis, unspecified cause: Secondary | ICD-10-CM

## 2012-09-12 DIAGNOSIS — Z23 Encounter for immunization: Secondary | ICD-10-CM

## 2012-09-12 MED ORDER — PSEUDOEPHEDRINE HCL ER 120 MG PO TB12: 120.0000 mg | ORAL_TABLET | Freq: Two times a day (BID) | ORAL | Status: DC | PRN

## 2012-09-12 MED ORDER — METHYLPREDNISOLONE ACETATE 80 MG/ML IJ SUSP
120.0000 mg | Freq: Once | INTRAMUSCULAR | Status: AC
  Administered 2012-09-12: 120 mg via INTRAMUSCULAR

## 2012-09-12 MED ORDER — LORATADINE 10 MG PO TABS: 10.0000 mg | ORAL_TABLET | Freq: Every day | ORAL | Status: DC

## 2012-09-12 MED ORDER — PREDNISONE 10 MG PO TABS: ORAL_TABLET | ORAL | Status: DC

## 2012-09-12 MED ORDER — TRIAMCINOLONE ACETONIDE 0.5 % EX OINT: TOPICAL_OINTMENT | Freq: Three times a day (TID) | CUTANEOUS | Status: DC

## 2012-09-12 NOTE — Telephone Encounter (Signed)
Pt is requesting to be seen today and you do not have slots----Pt sore throat is not better and she refuse to see another md---Can she be worked in today on your--

## 2012-09-12 NOTE — Assessment & Plan Note (Signed)
Depomedrol IM  Claritin 10 mg/d Sudafed

## 2012-09-12 NOTE — Assessment & Plan Note (Signed)
9/13 lips - refractory ?etiology Triamcinolone oint tid-qid  If worse: Prednisone 10 mg: take 4 tabs a day x 3 days; then 3 tabs a day x 4 days; then 2 tabs a day x 4 days, then 1 tab a day x 6 days, then stop. Take pc.

## 2012-09-12 NOTE — Progress Notes (Signed)
  Subjective:    Patient ID: Olivia Odom, female    DOB: 1946/02/07, 66 y.o.   MRN: [id number]  HPI  C/o lip swelling that started 6 wks ago - now cont to have raw lips and swelling. No fever, no mouth lesions. C/o some ST  Wt Readings from Last 3 Encounters:  09/12/12 135 lb 6 oz (61.406 kg)  08/11/12 135 lb (61.236 kg)  01/15/12 140 lb (63.504 kg)      Review of Systems  Constitutional: Negative for chills, activity change, appetite change, fatigue and unexpected weight change.  HENT: Positive for sore throat and facial swelling (lips). Negative for hearing loss, nosebleeds, congestion, mouth sores, trouble swallowing, neck stiffness, voice change and sinus pressure.   Eyes: Negative for visual disturbance.  Respiratory: Negative for cough and chest tightness.   Gastrointestinal: Negative for nausea and abdominal pain.  Genitourinary: Negative for frequency, difficulty urinating and vaginal pain.  Musculoskeletal: Negative for back pain and gait problem.  Skin: Negative for pallor and rash.  Neurological: Negative for dizziness, tremors, weakness, numbness and headaches.  Psychiatric/Behavioral: Negative for confusion and disturbed wake/sleep cycle.       Objective:   Physical Exam  Constitutional: She appears well-developed. No distress.  HENT:  Head: Normocephalic.  Right Ear: External ear normal.  Left Ear: External ear normal.  Nose: Nose normal.  Mouth/Throat: Oropharynx is clear and moist. No oropharyngeal exudate.       Throat WNL  Eyes: Conjunctivae normal are normal. Pupils are equal, round, and reactive to light. Right eye exhibits no discharge. Left eye exhibits no discharge.  Neck: Normal range of motion. Neck supple. No JVD present. No tracheal deviation present. No thyromegaly present.  Cardiovascular: Normal rate, regular rhythm and normal heart sounds.   Pulmonary/Chest: No stridor. No respiratory distress. She has no wheezes.  Abdominal: Soft. Bowel  sounds are normal. She exhibits no distension and no mass. There is no tenderness. There is no rebound and no guarding.  Musculoskeletal: She exhibits no edema and no tenderness.  Lymphadenopathy:    She has cervical adenopathy (mild, sensitive).  Neurological: She displays normal reflexes. No cranial nerve deficit. She exhibits normal muscle tone. Coordination normal.  Skin: Rash (lips are erythematous with erosions and some swelling) noted. No erythema.  Psychiatric: She has a normal mood and affect. Her behavior is normal. Judgment and thought content normal.          Assessment & Plan:

## 2012-09-12 NOTE — Telephone Encounter (Signed)
Ok to work in today Thx 

## 2012-09-16 LAB — ALLERGEN FOOD PROFILE SPECIFIC IGE
Apple: <0.1 kU/L
Egg White IgE: <0.1 kU/L
Milk IgE: <0.1 kU/L
Orange: <0.1 kU/L
Peanut IgE: <0.1 kU/L
Shrimp IgE: <0.1 kU/L
Tomato IgE: <0.1 kU/L
Tuna IgE: <0.1 kU/L

## 2012-09-17 ENCOUNTER — Telehealth: Payer: Self-pay | Admitting: Internal Medicine

## 2012-09-17 LAB — ALLERGY PROFILE REGION II-DC, DE, MD, ~~LOC~~, VA
Allergen, D pternoyssinus,d7: <0.1 kU/L
Alternaria Alternata: <0.1 kU/L
Aspergillus fumigatus, IgG: <0.1 kU/L
Box Elder IgE: <0.1 kU/L
Cladosporium Herbarum: <0.1 kU/L
Cockroach: <0.1 kU/L
IgE (Immunoglobulin E), Serum: 5.6 IU/mL (ref 0.0–180.0)
Johnson Grass: <0.1 kU/L
Pecan/Hickory Tree IgE: <0.1 kU/L

## 2012-09-17 NOTE — Telephone Encounter (Signed)
Olivia Odom, please, inform patient that her preliminary results show allergy to beef and eggs: please avoid them in diet. Is she better? Is she taking po prednisone? Thx

## 2012-09-18 NOTE — Telephone Encounter (Signed)
Pt informed. She is taking the prednisone and antihistamine. She states all the bumps are gone and she just has a little irritation. She states she had taken more than usual of  Ambien prior to her symptoms starting. Do you think she needs a different sleeping medication?

## 2012-09-19 NOTE — Telephone Encounter (Signed)
I do not think Ambien is a problem. More allergy tests keep coming back - she is very allergic to milk and wheat: please avoid all dairy and bread products for now. OK Gluten free bread, crackers.  Please, mail the labs to the patient.    Thx

## 2012-09-19 NOTE — Telephone Encounter (Signed)
Pt informed. Copies mailed 

## 2012-09-20 LAB — IGG FOOD PANEL
Chicken, IgG: <0.15 ug/mL (ref <0.15)
Corn, IgG: 0.2 ug/mL — ABNORMAL HIGH (ref <0.15)
Egg yolk, IgG: 10.1 ug/mL — ABNORMAL HIGH (ref <2.0)
Wheat, IgG: 13.2 ug/mL — ABNORMAL HIGH (ref <0.15)

## 2012-10-11 ENCOUNTER — Other Ambulatory Visit: Payer: Self-pay | Admitting: Internal Medicine

## 2012-10-13 NOTE — Telephone Encounter (Signed)
Ok to Rf? 

## 2012-10-15 ENCOUNTER — Telehealth: Payer: Self-pay | Admitting: *Deleted

## 2012-10-15 NOTE — Telephone Encounter (Signed)
Yes, she can see an allergist. Avoid food allergens (per lab report) x2 mo and see if better Thx

## 2012-10-15 NOTE — Telephone Encounter (Signed)
Pt informed

## 2012-10-15 NOTE — Telephone Encounter (Signed)
I called pt- she states she is taking the antihistamine qod. Her mouth ulcers are still present but it seems to be getting better. She wants to know if she should see an allergist.

## 2013-04-23 LAB — HM PAP SMEAR

## 2013-04-23 LAB — HM MAMMOGRAPHY

## 2013-05-07 ENCOUNTER — Other Ambulatory Visit: Payer: Self-pay | Admitting: Internal Medicine

## 2013-05-11 NOTE — Telephone Encounter (Signed)
Notified [facility] spoke with brandy gave approval...lmb

## 2013-06-04 ENCOUNTER — Ambulatory Visit (INDEPENDENT_AMBULATORY_CARE_PROVIDER_SITE_OTHER): Payer: BC Managed Care – PPO | Admitting: Internal Medicine

## 2013-06-04 ENCOUNTER — Encounter: Payer: Self-pay | Admitting: Internal Medicine

## 2013-06-04 VITALS — BP 128/82 | HR 81 | Temp 98.0°F | Ht 62.0 in | Wt 131.0 lb

## 2013-06-04 DIAGNOSIS — J309 Allergic rhinitis, unspecified: Secondary | ICD-10-CM

## 2013-06-04 NOTE — Patient Instructions (Signed)
Allergic Rhinitis Allergic rhinitis is when the mucous membranes in the nose respond to allergens. Allergens are particles in the air that cause your body to have an allergic reaction. This causes you to release allergic antibodies. Through a chain of events, these eventually cause you to release histamine into the blood stream (hence the use of antihistamines). Although meant to be protective to the body, it is this release that causes your discomfort, such as frequent sneezing, congestion and an itchy runny nose.  CAUSES  The pollen allergens may come from grasses, trees, and weeds. This is seasonal allergic rhinitis, or "hay fever." Other allergens cause year-round allergic rhinitis (perennial allergic rhinitis) such as house dust mite allergen, pet dander and mold spores.  SYMPTOMS   Nasal stuffiness (congestion).  Runny, itchy nose with sneezing and tearing of the eyes.  There is often an itching of the mouth, eyes and ears. It cannot be cured, but it can be controlled with medications. DIAGNOSIS  If you are unable to determine the offending allergen, skin or blood testing may find it. TREATMENT   Avoid the allergen.  Medications and allergy shots (immunotherapy) can help.  Hay fever may often be treated with antihistamines in pill or nasal spray forms. Antihistamines block the effects of histamine. There are over-the-counter medicines that may help with nasal congestion and swelling around the eyes. Check with your caregiver before taking or giving this medicine. If the treatment above does not work, there are many new medications your caregiver can prescribe. Stronger medications may be used if initial measures are ineffective. Desensitizing injections can be used if medications and avoidance fails. Desensitization is when a patient is given ongoing shots until the body becomes less sensitive to the allergen. Make sure you follow up with your caregiver if problems continue. SEEK MEDICAL  CARE IF:   You develop fever (more than 100.5 F (38.1 C).  You develop a cough that does not stop easily (persistent).  You have shortness of breath.  You start wheezing.  Symptoms interfere with normal daily activities. Document Released: 09/04/2001 Document Revised: 03/03/2012 Document Reviewed: 03/16/2009 Exit[employer] Patient Information 2014 [employer].  

## 2013-06-04 NOTE — Progress Notes (Signed)
HPI  Pt presents to the clinic today with c/o sore throat, cough, post nasal drip and right ear pain. This started 1 week ago. Her cough is unproductive. She denies fever, chills or body aches. She does have a history of allergies.  She does take Claritin OTC. She has no history of asthma. She has no thad sick contacts.  Review of Systems   History reviewed. No pertinent past medical history.  Family History  Problem Relation Age of Onset  . Cancer Mother 58    breast cancer  . Heart disease Father 46    MI    History   Social History  . Marital Status: Married    Spouse Name: N/A    Number of Children: N/A  . Years of Education: N/A   Occupational History  . Not on file.   Social History Main Topics  . Smoking status: Never Smoker   . Smokeless tobacco: Not on file  . Alcohol Use: 0.6 oz/week    1 Glasses of wine per week  . Drug Use: No  . Sexually Active: Yes   Other Topics Concern  . Not on file   Social History Narrative  . No narrative on file    No Known Allergies   Constitutional:  Denies headache, fatigue, fever or abrupt weight changes.  HEENT:  Positive nasal congestion and sore throat. Denies eye redness, ear pain, ringing in the ears, wax buildup, runny nose or bloody nose. Respiratory: Positive cough. Denies difficulty breathing or shortness of breath.  Cardiovascular: Denies chest pain, chest tightness, palpitations or swelling in the hands or feet.   No other specific complaints in a complete review of systems (except as listed in HPI above).  Objective:    BP 128/82  Pulse 81  Temp(Src) 98 F (36.7 C) (Oral)  Ht 5\' 2"  (1.575 m)  Wt 131 lb (59.421 kg)  BMI 23.95 kg/m2  SpO2 96% Wt Readings from Last 3 Encounters:  06/04/13 131 lb (59.421 kg)  09/12/12 135 lb 6 oz (61.406 kg)  08/11/12 135 lb (61.236 kg)    General: Appears her stated age, well developed, well nourished in NAD. HEENT: Head: normal shape and size; Eyes: sclera white,  no icterus, conjunctiva pink, PERRLA and EOMs intact; Ears: Tm's gray and intact, normal light reflex; Nose: mucosa pink and moist, septum midline; Throat/Mouth: + PND. Teeth present, mucosa pink and moist, no exudate noted, no lesions or ulcerations noted.  Neck: Mild cervical lymphadenopathy. Neck supple, trachea midline. No massses, lumps or thyromegaly present.  Cardiovascular: Normal rate and rhythm. S1,S2 noted.  No murmur, rubs or gallops noted. No JVD or BLE edema. No carotid bruits noted. Pulmonary/Chest: Normal effort and positive vesicular breath sounds. No respiratory distress. No wheezes, rales or ronchi noted.      Assessment & Plan:   Allergic Rhinitis  Can use a Neti Pot which can be purchased from your local drug store. Flonase 2 sprays each nostril for 3 days and then as needed. Continue OTC claritin Salt water gargles as needed for sore throat   RTC as needed or if symptoms persist.

## 2013-11-13 ENCOUNTER — Other Ambulatory Visit: Payer: Self-pay | Admitting: Internal Medicine

## 2013-11-20 ENCOUNTER — Telehealth: Payer: Self-pay

## 2013-11-20 NOTE — Telephone Encounter (Signed)
Ambien was phoned into [facility].

## 2014-02-22 DIAGNOSIS — H2510 Age-related nuclear cataract, unspecified eye: Secondary | ICD-10-CM | POA: Diagnosis not present

## 2014-02-26 ENCOUNTER — Other Ambulatory Visit (INDEPENDENT_AMBULATORY_CARE_PROVIDER_SITE_OTHER): Payer: Medicare Other

## 2014-02-26 ENCOUNTER — Encounter: Payer: Self-pay | Admitting: Internal Medicine

## 2014-02-26 ENCOUNTER — Ambulatory Visit (INDEPENDENT_AMBULATORY_CARE_PROVIDER_SITE_OTHER): Payer: Medicare Other | Admitting: Internal Medicine

## 2014-02-26 VITALS — BP 120/70 | HR 76 | Temp 97.0°F | Resp 16 | Ht 62.0 in | Wt 141.0 lb

## 2014-02-26 DIAGNOSIS — E559 Vitamin D deficiency, unspecified: Secondary | ICD-10-CM | POA: Diagnosis not present

## 2014-02-26 DIAGNOSIS — Z136 Encounter for screening for cardiovascular disorders: Secondary | ICD-10-CM

## 2014-02-26 DIAGNOSIS — E785 Hyperlipidemia, unspecified: Secondary | ICD-10-CM | POA: Diagnosis not present

## 2014-02-26 DIAGNOSIS — Z23 Encounter for immunization: Secondary | ICD-10-CM

## 2014-02-26 DIAGNOSIS — T783XXA Angioneurotic edema, initial encounter: Secondary | ICD-10-CM

## 2014-02-26 DIAGNOSIS — Z0000 Encounter for general adult medical examination without abnormal findings: Secondary | ICD-10-CM

## 2014-02-26 DIAGNOSIS — G4700 Insomnia, unspecified: Secondary | ICD-10-CM

## 2014-02-26 LAB — LIPID PANEL
CHOL/HDL RATIO: 3
Cholesterol: 261 mg/dL — ABNORMAL HIGH (ref 0–200)
HDL: 76.6 mg/dL (ref >39.00)
LDL Cholesterol: 171 mg/dL — ABNORMAL HIGH (ref 0–99)
Triglycerides: 69 mg/dL (ref 0.0–149.0)
VLDL: 13.8 mg/dL (ref 0.0–40.0)

## 2014-02-26 LAB — URINALYSIS
Bilirubin Urine: NEGATIVE
Hgb urine dipstick: NEGATIVE
Ketones, ur: NEGATIVE
Leukocytes, UA: NEGATIVE
NITRITE: NEGATIVE
PH: 6 (ref 5.0–8.0)
Specific Gravity, Urine: 1.025 (ref 1.000–1.030)
TOTAL PROTEIN, URINE-UPE24: NEGATIVE
URINE GLUCOSE: NEGATIVE
Urobilinogen, UA: 0.2 (ref 0.0–1.0)

## 2014-02-26 LAB — HEPATIC FUNCTION PANEL
ALBUMIN: 4.2 g/dL (ref 3.5–5.2)
ALK PHOS: 64 U/L (ref 39–117)
ALT: 22 U/L (ref 0–35)
AST: 20 U/L (ref 0–37)
BILIRUBIN DIRECT: 0.1 mg/dL (ref 0.0–0.3)
TOTAL PROTEIN: 7.3 g/dL (ref 6.0–8.3)
Total Bilirubin: 0.8 mg/dL (ref 0.3–1.2)

## 2014-02-26 LAB — BASIC METABOLIC PANEL
BUN: 13 mg/dL (ref 6–23)
CO2: 29 meq/L (ref 19–32)
Calcium: 9.5 mg/dL (ref 8.4–10.5)
Chloride: 104 mEq/L (ref 96–112)
Creatinine, Ser: 0.8 mg/dL (ref 0.4–1.2)
GFR: 72.18 mL/min (ref >60.00)
GLUCOSE: 93 mg/dL (ref 70–99)
Potassium: 4.6 mEq/L (ref 3.5–5.1)
SODIUM: 139 meq/L (ref 135–145)

## 2014-02-26 LAB — CBC WITH DIFFERENTIAL/PLATELET
BASOS PCT: 0.5 % (ref 0.0–3.0)
Basophils Absolute: 0 10*3/uL (ref 0.0–0.1)
Eosinophils Absolute: 0 10*3/uL (ref 0.0–0.7)
Eosinophils Relative: 0.3 % (ref 0.0–5.0)
HEMATOCRIT: 44.8 % (ref 36.0–46.0)
Hemoglobin: 15.2 g/dL — ABNORMAL HIGH (ref 12.0–15.0)
LYMPHS ABS: 2.2 10*3/uL (ref 0.7–4.0)
Lymphocytes Relative: 35.2 % (ref 12.0–46.0)
MCHC: 33.9 g/dL (ref 30.0–36.0)
MCV: 92.4 fl (ref 78.0–100.0)
MONO ABS: 0.5 10*3/uL (ref 0.1–1.0)
Monocytes Relative: 8.1 % (ref 3.0–12.0)
Neutro Abs: 3.4 10*3/uL (ref 1.4–7.7)
Neutrophils Relative %: 55.9 % (ref 43.0–77.0)
PLATELETS: 266 10*3/uL (ref 150.0–400.0)
RBC: 4.85 Mil/uL (ref 3.87–5.11)
RDW: 12.8 % (ref 11.5–14.6)
WBC: 6.2 10*3/uL (ref 4.5–10.5)

## 2014-02-26 LAB — TSH: TSH: 0.86 u[IU]/mL (ref 0.35–5.50)

## 2014-02-26 MED ORDER — VALACYCLOVIR HCL 1 G PO TABS: 1000.0000 mg | ORAL_TABLET | Freq: Two times a day (BID) | ORAL | Status: DC

## 2014-02-26 MED ORDER — ZOLPIDEM TARTRATE 10 MG PO TABS: ORAL_TABLET | ORAL | Status: DC

## 2014-02-26 NOTE — Progress Notes (Signed)
   Subjective:    HPI  The patient is here for a wellness exam. The patient has been doing well overall without major physical or psychological issues going on lately. No lip swelling post H simplex treatment. Colonoscopy q 3 years  Wt Readings from Last 3 Encounters:  02/26/14 141 lb (63.957 kg)  06/04/13 131 lb (59.421 kg)  09/12/12 135 lb 6 oz (61.406 kg)   BP Readings from Last 3 Encounters:  02/26/14 120/70  06/04/13 128/82  09/12/12 130/82     Review of Systems  Constitutional: Negative for chills, activity change, appetite change, fatigue and unexpected weight change.  HENT: Negative for congestion, mouth sores and sinus pressure.   Eyes: Negative for visual disturbance.  Respiratory: Negative for cough and chest tightness.   Gastrointestinal: Negative for nausea and abdominal pain.  Genitourinary: Negative for frequency, difficulty urinating and vaginal pain.  Musculoskeletal: Negative for back pain and gait problem.  Skin: Negative for pallor and rash.  Neurological: Negative for dizziness, tremors, weakness, numbness and headaches.  Psychiatric/Behavioral: Negative for suicidal ideas, confusion and sleep disturbance.       Objective:   Physical Exam  Constitutional: She appears well-developed and well-nourished. No distress.  Looks younger than her stated age  HENT:  Head: Normocephalic.  Right Ear: External ear normal.  Left Ear: External ear normal.  Nose: Nose normal.  Mouth/Throat: Oropharynx is clear and moist.  Eyes: Conjunctivae are normal. Pupils are equal, round, and reactive to light. Right eye exhibits no discharge. Left eye exhibits no discharge.  Neck: Normal range of motion. Neck supple. No JVD present. No tracheal deviation present. No thyromegaly present.  Cardiovascular: Normal rate, regular rhythm and normal heart sounds.   Pulmonary/Chest: No stridor. No respiratory distress. She has no wheezes.  Abdominal: Soft. Bowel sounds are normal.  She exhibits no distension and no mass. There is no tenderness. There is no rebound and no guarding.  Musculoskeletal: She exhibits no edema and no tenderness.  Lymphadenopathy:    She has no cervical adenopathy.  Neurological: She displays normal reflexes. No cranial nerve deficit. She exhibits normal muscle tone. Coordination normal.  Skin: No rash noted. No erythema.  Psychiatric: She has a normal mood and affect. Her behavior is normal. Judgment and thought content normal.    Lab Results  Component Value Date   WBC 6.2 02/26/2014   HGB 15.2* 02/26/2014   HCT 44.8 02/26/2014   PLT 266.0 02/26/2014   GLUCOSE 93 02/26/2014   CHOL 261* 02/26/2014   TRIG 69.0 02/26/2014   HDL 76.60 02/26/2014   LDLDIRECT 160.7 01/09/2012   LDLCALC 171* 02/26/2014   ALT 22 02/26/2014   AST 20 02/26/2014   NA 139 02/26/2014   K 4.6 02/26/2014   CL 104 02/26/2014   CREATININE 0.8 02/26/2014   BUN 13 02/26/2014   CO2 29 02/26/2014   TSH 0.86 02/26/2014         Assessment & Plan:

## 2014-02-26 NOTE — Progress Notes (Signed)
Pre visit review using our clinic review tool, if applicable. No additional management support is needed unless otherwise documented below in the visit note. 

## 2014-03-03 NOTE — Assessment & Plan Note (Signed)
Continue with current Vit D therapy as reflected on the Med list.  

## 2014-03-03 NOTE — Assessment & Plan Note (Signed)

## 2014-03-03 NOTE — Assessment & Plan Note (Signed)
  On diet  

## 2014-03-03 NOTE — Assessment & Plan Note (Signed)
Continue with current prescription prn therapy as reflected on the Med list.  

## 2014-03-03 NOTE — Assessment & Plan Note (Signed)
No relapse 

## 2014-05-19 DIAGNOSIS — Z124 Encounter for screening for malignant neoplasm of cervix: Secondary | ICD-10-CM | POA: Diagnosis not present

## 2014-05-19 DIAGNOSIS — Z1231 Encounter for screening mammogram for malignant neoplasm of breast: Secondary | ICD-10-CM | POA: Diagnosis not present

## 2014-09-10 DIAGNOSIS — L723 Sebaceous cyst: Secondary | ICD-10-CM | POA: Diagnosis not present

## 2014-09-16 DIAGNOSIS — L719 Rosacea, unspecified: Secondary | ICD-10-CM | POA: Diagnosis not present

## 2014-09-16 DIAGNOSIS — L723 Sebaceous cyst: Secondary | ICD-10-CM | POA: Diagnosis not present

## 2014-09-23 DIAGNOSIS — Z23 Encounter for immunization: Secondary | ICD-10-CM | POA: Diagnosis not present

## 2014-10-26 DIAGNOSIS — D124 Benign neoplasm of descending colon: Secondary | ICD-10-CM | POA: Diagnosis not present

## 2014-10-26 DIAGNOSIS — Z8371 Family history of colonic polyps: Secondary | ICD-10-CM | POA: Diagnosis not present

## 2014-10-26 DIAGNOSIS — D126 Benign neoplasm of colon, unspecified: Secondary | ICD-10-CM | POA: Diagnosis not present

## 2014-10-26 DIAGNOSIS — D123 Benign neoplasm of transverse colon: Secondary | ICD-10-CM | POA: Diagnosis not present

## 2014-10-26 DIAGNOSIS — Z86010 Personal history of colonic polyps: Secondary | ICD-10-CM | POA: Diagnosis not present

## 2014-10-26 DIAGNOSIS — Z1211 Encounter for screening for malignant neoplasm of colon: Secondary | ICD-10-CM | POA: Diagnosis not present

## 2014-11-01 ENCOUNTER — Other Ambulatory Visit: Payer: Self-pay | Admitting: Internal Medicine

## 2014-12-29 ENCOUNTER — Ambulatory Visit (INDEPENDENT_AMBULATORY_CARE_PROVIDER_SITE_OTHER): Payer: Medicare Other | Admitting: Internal Medicine

## 2014-12-29 ENCOUNTER — Encounter: Payer: Self-pay | Admitting: Internal Medicine

## 2014-12-29 VITALS — BP 160/90 | HR 74 | Temp 98.3°F | Resp 12 | Ht 62.0 in | Wt 144.0 lb

## 2014-12-29 DIAGNOSIS — M720 Palmar fascial fibromatosis [Dupuytren]: Secondary | ICD-10-CM

## 2014-12-29 DIAGNOSIS — R030 Elevated blood-pressure reading, without diagnosis of hypertension: Secondary | ICD-10-CM | POA: Diagnosis not present

## 2014-12-29 DIAGNOSIS — M7122 Synovial cyst of popliteal space [Baker], left knee: Secondary | ICD-10-CM

## 2014-12-29 DIAGNOSIS — Z23 Encounter for immunization: Secondary | ICD-10-CM

## 2014-12-29 DIAGNOSIS — IMO0001 Reserved for inherently not codable concepts without codable children: Secondary | ICD-10-CM

## 2014-12-29 DIAGNOSIS — T783XXS Angioneurotic edema, sequela: Secondary | ICD-10-CM | POA: Diagnosis not present

## 2014-12-29 DIAGNOSIS — I10 Essential (primary) hypertension: Secondary | ICD-10-CM | POA: Insufficient documentation

## 2014-12-29 MED ORDER — VITAMIN E 180 MG (400 UNIT) PO CAPS: 400.0000 [IU] | ORAL_CAPSULE | Freq: Every day | ORAL | Status: DC

## 2014-12-29 MED ORDER — ZOLPIDEM TARTRATE 10 MG PO TABS: ORAL_TABLET | ORAL | Status: DC

## 2014-12-29 MED ORDER — LOSARTAN POTASSIUM 100 MG PO TABS: 100.0000 mg | ORAL_TABLET | Freq: Every day | ORAL | Status: DC

## 2014-12-29 NOTE — Assessment & Plan Note (Signed)
Losartan if BP is up

## 2014-12-29 NOTE — Patient Instructions (Signed)
BP Readings from Last 3 Encounters:  12/29/14 160/90  02/26/14 120/70  06/04/13 128/82   Start Losartan if BP>140/95

## 2014-12-29 NOTE — Progress Notes (Signed)
   Subjective:    HPI  C/o left palm bumps C/o L knee pain 2-3 wks ago - resolved Elevated BP lately Colonoscopy q 3 years; due 2018  Wt Readings from Last 3 Encounters:  12/29/14 144 lb (65.318 kg)  02/26/14 141 lb (63.957 kg)  06/04/13 131 lb (59.421 kg)   BP Readings from Last 3 Encounters:  12/29/14 160/90  02/26/14 120/70  06/04/13 128/82     Review of Systems  Constitutional: Negative for chills, activity change, appetite change, fatigue and unexpected weight change.  HENT: Negative for congestion, mouth sores and sinus pressure.   Eyes: Negative for visual disturbance.  Respiratory: Negative for cough and chest tightness.   Gastrointestinal: Negative for nausea and abdominal pain.  Genitourinary: Negative for frequency, difficulty urinating and vaginal pain.  Musculoskeletal: Negative for back pain and gait problem.  Skin: Negative for pallor and rash.  Neurological: Negative for dizziness, tremors, weakness, numbness and headaches.  Psychiatric/Behavioral: Negative for suicidal ideas, confusion and sleep disturbance.       Objective:   Physical Exam  Constitutional: She appears well-developed. No distress.  HENT:  Head: Normocephalic.  Right Ear: External ear normal.  Left Ear: External ear normal.  Nose: Nose normal.  Mouth/Throat: Oropharynx is clear and moist.  Eyes: Conjunctivae are normal. Pupils are equal, round, and reactive to light. Right eye exhibits no discharge. Left eye exhibits no discharge.  Neck: Normal range of motion. Neck supple. No JVD present. No tracheal deviation present. No thyromegaly present.  Cardiovascular: Normal rate, regular rhythm and normal heart sounds.   Pulmonary/Chest: No stridor. No respiratory distress. She has no wheezes.  Abdominal: Soft. Bowel sounds are normal. She exhibits no distension and no mass. There is no tenderness. There is no rebound and no guarding.  Musculoskeletal: She exhibits no edema or tenderness.    Lymphadenopathy:    She has no cervical adenopathy.  Neurological: She displays normal reflexes. No cranial nerve deficit. She exhibits normal muscle tone. Coordination normal.  Skin: No rash noted. No erythema.  Psychiatric: She has a normal mood and affect. Her behavior is normal. Judgment and thought content normal.  2 hard nodules on L palm over MCP 3-4 Post L knee is a little fuller, NT; calves nl  Lab Results  Component Value Date   WBC 6.2 02/26/2014   HGB 15.2* 02/26/2014   HCT 44.8 02/26/2014   PLT 266.0 02/26/2014   GLUCOSE 93 02/26/2014   CHOL 261* 02/26/2014   TRIG 69.0 02/26/2014   HDL 76.60 02/26/2014   LDLDIRECT 160.7 01/09/2012   LDLCALC 171* 02/26/2014   ALT 22 02/26/2014   AST 20 02/26/2014   NA 139 02/26/2014   K 4.6 02/26/2014   CL 104 02/26/2014   CREATININE 0.8 02/26/2014   BUN 13 02/26/2014   CO2 29 02/26/2014   TSH 0.86 02/26/2014         Assessment & Plan:

## 2014-12-29 NOTE — Progress Notes (Signed)
Pre visit review using our clinic review tool, if applicable. No additional management support is needed unless otherwise documented below in the visit note. 

## 2015-01-02 DIAGNOSIS — M7120 Synovial cyst of popliteal space [Baker], unspecified knee: Secondary | ICD-10-CM | POA: Insufficient documentation

## 2015-01-02 DIAGNOSIS — M720 Palmar fascial fibromatosis [Dupuytren]: Secondary | ICD-10-CM | POA: Insufficient documentation

## 2015-01-02 NOTE — Assessment & Plan Note (Signed)
Resolved, no relapse

## 2015-01-02 NOTE — Assessment & Plan Note (Signed)
1/16 L 2 nodules over palm MCP 3-4 Discussed Options to treat - discussed

## 2015-01-02 NOTE — Assessment & Plan Note (Signed)
1/16 probable - L asymptomatic Will watch

## 2015-03-04 ENCOUNTER — Telehealth: Payer: Self-pay

## 2015-03-07 NOTE — Telephone Encounter (Signed)
Call to confirm receipt of flu vaccine. Patient stated she rec'd this at [facility] 08/2014

## 2015-04-24 LAB — HM MAMMOGRAPHY

## 2015-06-01 DIAGNOSIS — Z1231 Encounter for screening mammogram for malignant neoplasm of breast: Secondary | ICD-10-CM | POA: Diagnosis not present

## 2015-06-01 DIAGNOSIS — Z1289 Encounter for screening for malignant neoplasm of other sites: Secondary | ICD-10-CM | POA: Diagnosis not present

## 2015-06-21 ENCOUNTER — Other Ambulatory Visit: Payer: Self-pay

## 2015-06-21 ENCOUNTER — Telehealth: Payer: Self-pay | Admitting: Internal Medicine

## 2015-06-21 MED ORDER — ZOLPIDEM TARTRATE 10 MG PO TABS: ORAL_TABLET | ORAL | Status: DC

## 2015-06-21 NOTE — Telephone Encounter (Signed)
OK to fill this prescription with additional refills x3 Thank you!  

## 2015-06-21 NOTE — Telephone Encounter (Signed)
ambien rx faxed to [facility]

## 2015-06-21 NOTE — Telephone Encounter (Signed)
Patient need refill of Ambien 10 mg send to [facility] on [address].

## 2015-06-22 MED ORDER — ZOLPIDEM TARTRATE 10 MG PO TABS: ORAL_TABLET | ORAL | Status: DC

## 2015-06-22 NOTE — Telephone Encounter (Signed)
Printed Rx signed/faxed to pharmacy.

## 2015-09-30 DIAGNOSIS — Z23 Encounter for immunization: Secondary | ICD-10-CM | POA: Diagnosis not present

## 2015-10-13 DIAGNOSIS — H40013 Open angle with borderline findings, low risk, bilateral: Secondary | ICD-10-CM | POA: Diagnosis not present

## 2015-10-13 DIAGNOSIS — H2513 Age-related nuclear cataract, bilateral: Secondary | ICD-10-CM | POA: Diagnosis not present

## 2015-11-03 ENCOUNTER — Telehealth: Payer: Self-pay | Admitting: Internal Medicine

## 2015-11-03 NOTE — Telephone Encounter (Signed)
Any meds like Lorrin Mais would have the same warning. We can try Belsomra or Trazodone - pls look up and let me know Thx

## 2015-11-03 NOTE — Telephone Encounter (Signed)
Pt called in and said that the Ambien is working well for her but she received a letter in the mail from ins compy that Lorrin Mais was not recommend for over the age of 32.  Do you suggest anything else

## 2015-11-04 NOTE — Telephone Encounter (Signed)
Left detailed mess informing pt of below.  

## 2015-11-07 MED ORDER — SUVOREXANT 15 MG PO TABS: 15.0000 mg | ORAL_TABLET | Freq: Every evening | ORAL | Status: DC | PRN

## 2015-11-07 NOTE — Telephone Encounter (Signed)
Ok. Pick up Rx and a packet w/info Thx

## 2015-11-07 NOTE — Telephone Encounter (Signed)
Pt called in and said that she would like to try the [place].  Can this be called in for her?

## 2015-11-07 NOTE — Addendum Note (Signed)
Addended by: Cassandria Anger on: 11/07/2015 05:15 PM   Modules accepted: Orders

## 2015-11-08 NOTE — Telephone Encounter (Signed)
Belsomra Rx, savings card and info packet are upfront for p/u. Left detailed mess informing pt.

## 2015-11-11 ENCOUNTER — Telehealth: Payer: Self-pay | Admitting: Internal Medicine

## 2015-11-11 NOTE — Telephone Encounter (Signed)
Pt called in and said that she did not fill the belsomra  Just fyi

## 2015-11-14 NOTE — Telephone Encounter (Signed)
Noted updated med list.../lmb

## 2015-12-08 ENCOUNTER — Other Ambulatory Visit: Payer: Self-pay | Admitting: Internal Medicine

## 2015-12-15 NOTE — Telephone Encounter (Signed)
Called rx into [facility] had to leave on pharmacy vm...Olivia Odom

## 2016-03-06 ENCOUNTER — Ambulatory Visit (INDEPENDENT_AMBULATORY_CARE_PROVIDER_SITE_OTHER): Payer: Medicare Other | Admitting: Internal Medicine

## 2016-03-06 ENCOUNTER — Encounter: Payer: Self-pay | Admitting: Internal Medicine

## 2016-03-06 VITALS — BP 158/90 | HR 75 | Ht 62.0 in | Wt 146.0 lb

## 2016-03-06 DIAGNOSIS — E559 Vitamin D deficiency, unspecified: Secondary | ICD-10-CM

## 2016-03-06 DIAGNOSIS — E785 Hyperlipidemia, unspecified: Secondary | ICD-10-CM

## 2016-03-06 DIAGNOSIS — G4700 Insomnia, unspecified: Secondary | ICD-10-CM

## 2016-03-06 DIAGNOSIS — R030 Elevated blood-pressure reading, without diagnosis of hypertension: Secondary | ICD-10-CM

## 2016-03-06 DIAGNOSIS — Z0000 Encounter for general adult medical examination without abnormal findings: Secondary | ICD-10-CM | POA: Diagnosis not present

## 2016-03-06 DIAGNOSIS — IMO0001 Reserved for inherently not codable concepts without codable children: Secondary | ICD-10-CM

## 2016-03-06 MED ORDER — ZOLPIDEM TARTRATE 10 MG PO TABS: 5.0000 mg | ORAL_TABLET | Freq: Every evening | ORAL | Status: DC | PRN

## 2016-03-06 NOTE — Assessment & Plan Note (Signed)
Vit D labs

## 2016-03-06 NOTE — Progress Notes (Signed)
Subjective:  Patient ID: Olivia Odom, female    DOB: 25-Jul-1946  Age: 70 y.o. MRN: [id number]  CC: No chief complaint on file.   HPI Olivia Odom presents for a well exam F/u elev BP - not on meds  Outpatient Prescriptions Prior to Visit  Medication Sig Dispense Refill  . cholecalciferol (VITAMIN D) 1000 UNITS tablet Take 1,000 Units by mouth daily.      . vitamin E ([facility] VITAMIN E) 400 UNIT capsule Take 1 capsule (400 Units total) by mouth daily. 100 capsule 3  . zolpidem (AMBIEN) 10 MG tablet TAKE ONE-HALF TO ONE TABLET BY MOUTH ONCE DAILY AT BEDTIME AS NEEDED 30 tablet 3  . loratadine (CLARITIN) 10 MG tablet Take 1 tablet (10 mg total) by mouth daily. (Patient not taking: Reported on 03/06/2016) 100 tablet 3  . losartan (COZAAR) 100 MG tablet Take 1 tablet (100 mg total) by mouth daily. (Patient not taking: Reported on 03/06/2016) 30 tablet 11  . Omega-3 Fatty Acids (FISH OIL) 1000 MG CAPS Take 1 each by mouth 2 (two) times daily. Reported on 03/06/2016     No facility-administered medications prior to visit.    ROS Review of Systems  Constitutional: Negative for chills, activity change, appetite change, fatigue and unexpected weight change.  HENT: Negative for congestion, mouth sores and sinus pressure.   Eyes: Negative for visual disturbance.  Respiratory: Negative for cough and chest tightness.   Gastrointestinal: Negative for nausea and abdominal pain.  Genitourinary: Negative for frequency, difficulty urinating and vaginal pain.  Musculoskeletal: Negative for back pain and gait problem.  Skin: Negative for pallor and rash.  Neurological: Negative for dizziness, tremors, weakness, numbness and headaches.  Psychiatric/Behavioral: Negative for suicidal ideas, confusion and sleep disturbance.    Objective:  BP 158/90 mmHg  Pulse 75  Ht 5\' 2"  (1.575 m)  Wt 146 lb (66.225 kg)  BMI 26.70 kg/m2  SpO2 97%  BP Readings from Last 3 Encounters:  03/06/16 158/90    12/29/14 160/90  02/26/14 120/70    Wt Readings from Last 3 Encounters:  03/06/16 146 lb (66.225 kg)  12/29/14 144 lb (65.318 kg)  02/26/14 141 lb (63.957 kg)    Physical Exam  Constitutional: She appears well-developed. No distress.  HENT:  Head: Normocephalic.  Right Ear: External ear normal.  Left Ear: External ear normal.  Nose: Nose normal.  Mouth/Throat: Oropharynx is clear and moist.  Eyes: Conjunctivae are normal. Pupils are equal, round, and reactive to light. Right eye exhibits no discharge. Left eye exhibits no discharge.  Neck: Normal range of motion. Neck supple. No JVD present. No tracheal deviation present. No thyromegaly present.  Cardiovascular: Normal rate, regular rhythm and normal heart sounds.   Pulmonary/Chest: No stridor. No respiratory distress. She has no wheezes.  Abdominal: Soft. Bowel sounds are normal. She exhibits no distension and no mass. There is no tenderness. There is no rebound and no guarding.  Musculoskeletal: She exhibits no edema or tenderness.  Lymphadenopathy:    She has no cervical adenopathy.  Neurological: She displays normal reflexes. No cranial nerve deficit. She exhibits normal muscle tone. Coordination normal.  Skin: No rash noted. No erythema.  Psychiatric: She has a normal mood and affect. Her behavior is normal. Judgment and thought content normal.    Lab Results  Component Value Date   WBC 6.2 02/26/2014   HGB 15.2* 02/26/2014   HCT 44.8 02/26/2014   PLT 266.0 02/26/2014   GLUCOSE 93 02/26/2014  CHOL 261* 02/26/2014   TRIG 69.0 02/26/2014   HDL 76.60 02/26/2014   LDLDIRECT 160.7 01/09/2012   LDLCALC 171* 02/26/2014   ALT 22 02/26/2014   AST 20 02/26/2014   NA 139 02/26/2014   K 4.6 02/26/2014   CL 104 02/26/2014   CREATININE 0.8 02/26/2014   BUN 13 02/26/2014   CO2 29 02/26/2014   TSH 0.86 02/26/2014    No results found.  Assessment & Plan:   Diagnoses and all orders for this visit:  Well adult exam -      VITAMIN D 25 Hydroxy (Vit-D Deficiency, Fractures); Future -     Urinalysis; Future -     TSH; Future -     Lipid panel; Future -     Basic metabolic panel; Future -     CBC with Differential/Platelet; Future -     Hepatic function panel; Future  Dyslipidemia -     TSH; Future -     Lipid panel; Future -     Basic metabolic panel; Future  Elevated BP  INSOMNIA, PERSISTENT -     Lipid panel; Future  Vitamin D deficiency -     VITAMIN D 25 Hydroxy (Vit-D Deficiency, Fractures); Future -     Lipid panel; Future -     Basic metabolic panel; Future -     CBC with Differential/Platelet; Future -     Hepatic function panel; Future  Other orders -     zolpidem (AMBIEN) 10 MG tablet; Take 0.5-1 tablets (5-10 mg total) by mouth at bedtime as needed for sleep.  I have discontinued Olivia Odom's Fish Oil. I have also changed her zolpidem. Additionally, I am having her maintain her cholecalciferol, loratadine, losartan, and vitamin E.  Meds ordered this encounter  Medications  . zolpidem (AMBIEN) 10 MG tablet    Sig: Take 0.5-1 tablets (5-10 mg total) by mouth at bedtime as needed for sleep.    Dispense:  30 tablet    Refill:  3     Follow-up: Return for a follow-up visit.  Walker Kehr, MD

## 2016-03-06 NOTE — Assessment & Plan Note (Signed)
Monitor BP at home

## 2016-03-06 NOTE — Assessment & Plan Note (Signed)

## 2016-03-06 NOTE — Assessment & Plan Note (Signed)
Zolpidem prn  Potential benefits of a long term benzodiazepines  use as well as potential risks  and complications were explained to the patient and were aknowledged. 

## 2016-03-06 NOTE — Patient Instructions (Signed)
Normal BP <130/85       Preventive Care for Adults, Female A healthy lifestyle and preventive care can promote health and wellness. Preventive health guidelines for women include the following key practices.  A routine yearly physical is a good way to check with your health care provider about your health and preventive screening. It is a chance to share any concerns and updates on your health and to receive a thorough exam.  Visit your dentist for a routine exam and preventive care every 6 months. Brush your teeth twice a day and floss once a day. Good oral hygiene prevents tooth decay and gum disease.  The frequency of eye exams is based on your age, health, family medical history, use of contact lenses, and other factors. Follow your health care provider's recommendations for frequency of eye exams.  Eat a healthy diet. Foods like vegetables, fruits, whole grains, low-fat dairy products, and lean protein foods contain the nutrients you need without too many calories. Decrease your intake of foods high in solid fats, added sugars, and salt. Eat the right amount of calories for you.Get information about a proper diet from your health care provider, if necessary.  Regular physical exercise is one of the most important things you can do for your health. Most adults should get at least 150 minutes of moderate-intensity exercise (any activity that increases your heart rate and causes you to sweat) each week. In addition, most adults need muscle-strengthening exercises on 2 or more days a week.  Maintain a healthy weight. The body mass index (BMI) is a screening tool to identify possible weight problems. It provides an estimate of body fat based on height and weight. Your health care provider can find your BMI and can help you achieve or maintain a healthy weight.For adults 20 years and older:  A BMI below 18.5 is considered underweight.  A BMI of 18.5 to 24.9 is normal.  A BMI of 25 to  29.9 is considered overweight.  A BMI of 30 and above is considered obese.  Maintain normal blood lipids and cholesterol levels by exercising and minimizing your intake of saturated fat. Eat a balanced diet with plenty of fruit and vegetables. Blood tests for lipids and cholesterol should begin at age 51 and be repeated every 5 years. If your lipid or cholesterol levels are high, you are over 50, or you are at high risk for heart disease, you may need your cholesterol levels checked more frequently.Ongoing high lipid and cholesterol levels should be treated with medicines if diet and exercise are not working.  If you smoke, find out from your health care provider how to quit. If you do not use tobacco, do not start.  Lung cancer screening is recommended for adults aged 51-80 years who are at high risk for developing lung cancer because of a history of smoking. A yearly low-dose CT scan of the lungs is recommended for people who have at least a 30-pack-year history of smoking and are a current smoker or have quit within the past 15 years. A pack year of smoking is smoking an average of 1 pack of cigarettes a day for 1 year (for example: 1 pack a day for 30 years or 2 packs a day for 15 years). Yearly screening should continue until the smoker has stopped smoking for at least 15 years. Yearly screening should be stopped for people who develop a health problem that would prevent them from having lung cancer treatment.  If  you are pregnant, do not drink alcohol. If you are breastfeeding, be very cautious about drinking alcohol. If you are not pregnant and choose to drink alcohol, do not have more than 1 drink per day. One drink is considered to be 12 ounces (355 mL) of beer, 5 ounces (148 mL) of wine, or 1.5 ounces (44 mL) of liquor.  Avoid use of street drugs. Do not share needles with anyone. Ask for help if you need support or instructions about stopping the use of drugs.  High blood pressure causes  heart disease and increases the risk of stroke. Your blood pressure should be checked at least every 1 to 2 years. Ongoing high blood pressure should be treated with medicines if weight loss and exercise do not work.  If you are 47-36 years old, ask your health care provider if you should take aspirin to prevent strokes.  Diabetes screening is done by taking a blood sample to check your blood glucose level after you have not eaten for a certain period of time (fasting). If you are not overweight and you do not have risk factors for diabetes, you should be screened once every 3 years starting at age 68. If you are overweight or obese and you are 47-56 years of age, you should be screened for diabetes every year as part of your cardiovascular risk assessment.  Breast cancer screening is essential preventive care for women. You should practice "breast self-awareness." This means understanding the normal appearance and feel of your breasts and may include breast self-examination. Any changes detected, no matter how small, should be reported to a health care provider. Women in their 52s and 30s should have a clinical breast exam (CBE) by a health care provider as part of a regular health exam every 1 to 3 years. After age 56, women should have a CBE every year. Starting at age 20, women should consider having a mammogram (breast X-ray test) every year. Women who have a family history of breast cancer should talk to their health care provider about genetic screening. Women at a high risk of breast cancer should talk to their health care providers about having an MRI and a mammogram every year.  Breast cancer gene (BRCA)-related cancer risk assessment is recommended for women who have family members with BRCA-related cancers. BRCA-related cancers include breast, ovarian, tubal, and peritoneal cancers. Having family members with these cancers may be associated with an increased risk for harmful changes (mutations)  in the breast cancer genes BRCA1 and BRCA2. Results of the assessment will determine the need for genetic counseling and BRCA1 and BRCA2 testing.  Your health care provider may recommend that you be screened regularly for cancer of the pelvic organs (ovaries, uterus, and vagina). This screening involves a pelvic examination, including checking for microscopic changes to the surface of your cervix (Pap test). You may be encouraged to have this screening done every 3 years, beginning at age 19.  For women ages 14-65, health care providers may recommend pelvic exams and Pap testing every 3 years, or they may recommend the Pap and pelvic exam, combined with testing for human papilloma virus (HPV), every 5 years. Some types of HPV increase your risk of cervical cancer. Testing for HPV may also be done on women of any age with unclear Pap test results.  Other health care providers may not recommend any screening for nonpregnant women who are considered low risk for pelvic cancer and who do not have symptoms. Ask your health  care provider if a screening pelvic exam is right for you.  If you have had past treatment for cervical cancer or a condition that could lead to cancer, you need Pap tests and screening for cancer for at least 20 years after your treatment. If Pap tests have been discontinued, your risk factors (such as having a new sexual partner) need to be reassessed to determine if screening should resume. Some women have medical problems that increase the chance of getting cervical cancer. In these cases, your health care provider may recommend more frequent screening and Pap tests.  Colorectal cancer can be detected and often prevented. Most routine colorectal cancer screening begins at the age of 10 years and continues through age 57 years. However, your health care provider may recommend screening at an earlier age if you have risk factors for colon cancer. On a yearly basis, your health care provider  may provide home test kits to check for hidden blood in the stool. Use of a small camera at the end of a tube, to directly examine the colon (sigmoidoscopy or colonoscopy), can detect the earliest forms of colorectal cancer. Talk to your health care provider about this at age 48, when routine screening begins. Direct exam of the colon should be repeated every 5-10 years through age 78 years, unless early forms of precancerous polyps or small growths are found.  People who are at an increased risk for hepatitis B should be screened for this virus. You are considered at high risk for hepatitis B if:  You were born in a country where hepatitis B occurs often. Talk with your health care provider about which countries are considered high risk.  Your parents were born in a high-risk country and you have not received a shot to protect against hepatitis B (hepatitis B vaccine).  You have HIV or AIDS.  You use needles to inject street drugs.  You live with, or have sex with, someone who has hepatitis B.  You get hemodialysis treatment.  You take certain medicines for conditions like cancer, organ transplantation, and autoimmune conditions.  Hepatitis C blood testing is recommended for all people born from [birth year] through 1965 and any individual with known risks for hepatitis C.  Practice safe sex. Use condoms and avoid high-risk sexual practices to reduce the spread of sexually transmitted infections (STIs). STIs include gonorrhea, chlamydia, syphilis, trichomonas, herpes, HPV, and human immunodeficiency virus (HIV). Herpes, HIV, and HPV are viral illnesses that have no cure. They can result in disability, cancer, and death.  You should be screened for sexually transmitted illnesses (STIs) including gonorrhea and chlamydia if:  You are sexually active and are younger than 24 years.  You are older than 24 years and your health care provider tells you that you are at risk for this type of  infection.  Your sexual activity has changed since you were last screened and you are at an increased risk for chlamydia or gonorrhea. Ask your health care provider if you are at risk.  If you are at risk of being infected with HIV, it is recommended that you take a prescription medicine daily to prevent HIV infection. This is called preexposure prophylaxis (PrEP). You are considered at risk if:  You are sexually active and do not regularly use condoms or know the HIV status of your partner(s).  You take drugs by injection.  You are sexually active with a partner who has HIV.  Talk with your health care provider about whether you  are at high risk of being infected with HIV. If you choose to begin PrEP, you should first be tested for HIV. You should then be tested every 3 months for as long as you are taking PrEP.  Osteoporosis is a disease in which the bones lose minerals and strength with aging. This can result in serious bone fractures or breaks. The risk of osteoporosis can be identified using a bone density scan. Women ages 84 years and over and women at risk for fractures or osteoporosis should discuss screening with their health care providers. Ask your health care provider whether you should take a calcium supplement or vitamin D to reduce the rate of osteoporosis.  Menopause can be associated with physical symptoms and risks. Hormone replacement therapy is available to decrease symptoms and risks. You should talk to your health care provider about whether hormone replacement therapy is right for you.  Use sunscreen. Apply sunscreen liberally and repeatedly throughout the day. You should seek shade when your shadow is shorter than you. Protect yourself by wearing long sleeves, pants, a wide-brimmed hat, and sunglasses year round, whenever you are outdoors.  Once a month, do a whole body skin exam, using a mirror to look at the skin on your back. Tell your health care provider of new moles,  moles that have irregular borders, moles that are larger than a pencil eraser, or moles that have changed in shape or color.  Stay current with required vaccines (immunizations).  Influenza vaccine. All adults should be immunized every year.  Tetanus, diphtheria, and acellular pertussis (Td, Tdap) vaccine. Pregnant women should receive 1 dose of Tdap vaccine during each pregnancy. The dose should be obtained regardless of the length of time since the last dose. Immunization is preferred during the 27th-36th week of gestation. An adult who has not previously received Tdap or who does not know her vaccine status should receive 1 dose of Tdap. This initial dose should be followed by tetanus and diphtheria toxoids (Td) booster doses every 10 years. Adults with an unknown or incomplete history of completing a 3-dose immunization series with Td-containing vaccines should begin or complete a primary immunization series including a Tdap dose. Adults should receive a Td booster every 10 years.  Varicella vaccine. An adult without evidence of immunity to varicella should receive 2 doses or a second dose if she has previously received 1 dose. Pregnant females who do not have evidence of immunity should receive the first dose after pregnancy. This first dose should be obtained before leaving the health care facility. The second dose should be obtained 4-8 weeks after the first dose.  Human papillomavirus (HPV) vaccine. Females aged 13-26 years who have not received the vaccine previously should obtain the 3-dose series. The vaccine is not recommended for use in pregnant females. However, pregnancy testing is not needed before receiving a dose. If a female is found to be pregnant after receiving a dose, no treatment is needed. In that case, the remaining doses should be delayed until after the pregnancy. Immunization is recommended for any person with an immunocompromised condition through the age of 33 years if she  did not get any or all doses earlier. During the 3-dose series, the second dose should be obtained 4-8 weeks after the first dose. The third dose should be obtained 24 weeks after the first dose and 16 weeks after the second dose.  Zoster vaccine. One dose is recommended for adults aged 22 years or older unless certain conditions are  present.  Measles, mumps, and rubella (MMR) vaccine. Adults born before [birth year] generally are considered immune to measles and mumps. Adults born in [birth year] or later should have 1 or more doses of MMR vaccine unless there is a contraindication to the vaccine or there is laboratory evidence of immunity to each of the three diseases. A routine second dose of MMR vaccine should be obtained at least 28 days after the first dose for students attending postsecondary schools, health care workers, or international travelers. People who received inactivated measles vaccine or an unknown type of measles vaccine during 1963-1967 should receive 2 doses of MMR vaccine. People who received inactivated mumps vaccine or an unknown type of mumps vaccine before 1979 and are at high risk for mumps infection should consider immunization with 2 doses of MMR vaccine. For females of childbearing age, rubella immunity should be determined. If there is no evidence of immunity, females who are not pregnant should be vaccinated. If there is no evidence of immunity, females who are pregnant should delay immunization until after pregnancy. Unvaccinated health care workers born before 50 who lack laboratory evidence of measles, mumps, or rubella immunity or laboratory confirmation of disease should consider measles and mumps immunization with 2 doses of MMR vaccine or rubella immunization with 1 dose of MMR vaccine.  Pneumococcal 13-valent conjugate (PCV13) vaccine. When indicated, a person who is uncertain of his immunization history and has no record of immunization should receive the PCV13 vaccine. All adults  67 years of age and older should receive this vaccine. An adult aged 64 years or older who has certain medical conditions and has not been previously immunized should receive 1 dose of PCV13 vaccine. This PCV13 should be followed with a dose of pneumococcal polysaccharide (PPSV23) vaccine. Adults who are at high risk for pneumococcal disease should obtain the PPSV23 vaccine at least 8 weeks after the dose of PCV13 vaccine. Adults older than 68 years of age who have normal immune system function should obtain the PPSV23 vaccine dose at least 1 year after the dose of PCV13 vaccine.  Pneumococcal polysaccharide (PPSV23) vaccine. When PCV13 is also indicated, PCV13 should be obtained first. All adults aged 75 years and older should be immunized. An adult younger than age 75 years who has certain medical conditions should be immunized. Any person who resides in a nursing home or long-term care facility should be immunized. An adult smoker should be immunized. People with an immunocompromised condition and certain other conditions should receive both PCV13 and PPSV23 vaccines. People with human immunodeficiency virus (HIV) infection should be immunized as soon as possible after diagnosis. Immunization during chemotherapy or radiation therapy should be avoided. Routine use of PPSV23 vaccine is not recommended for American Indians, [place] Natives, or people younger than 65 years unless there are medical conditions that require PPSV23 vaccine. When indicated, people who have unknown immunization and have no record of immunization should receive PPSV23 vaccine. One-time revaccination 5 years after the first dose of PPSV23 is recommended for people aged 19-64 years who have chronic kidney failure, nephrotic syndrome, asplenia, or immunocompromised conditions. People who received 1-2 doses of PPSV23 before age 74 years should receive another dose of PPSV23 vaccine at age 4 years or later if at least 5 years have passed since  the previous dose. Doses of PPSV23 are not needed for people immunized with PPSV23 at or after age 85 years.  Meningococcal vaccine. Adults with asplenia or persistent complement component deficiencies should receive 2 doses of  quadrivalent meningococcal conjugate (MenACWY-D) vaccine. The doses should be obtained at least 2 months apart. Microbiologists working with certain meningococcal bacteria, [place] recruits, people at risk during an outbreak, and people who travel to or live in countries with a high rate of meningitis should be immunized. A first-year college student up through age 61 years who is living in a residence hall should receive a dose if she did not receive a dose on or after her 16th birthday. Adults who have certain high-risk conditions should receive one or more doses of vaccine.  Hepatitis A vaccine. Adults who wish to be protected from this disease, have certain high-risk conditions, work with hepatitis A-infected animals, work in hepatitis A research labs, or travel to or work in countries with a high rate of hepatitis A should be immunized. Adults who were previously unvaccinated and who anticipate close contact with an international adoptee during the first 60 days after arrival in the [place] from a country with a high rate of hepatitis A should be immunized.  Hepatitis B vaccine. Adults who wish to be protected from this disease, have certain high-risk conditions, may be exposed to blood or other infectious body fluids, are household contacts or sex partners of hepatitis B positive people, are clients or workers in certain care facilities, or travel to or work in countries with a high rate of hepatitis B should be immunized.  Haemophilus influenzae type b (Hib) vaccine. A previously unvaccinated person with asplenia or sickle cell disease or having a scheduled splenectomy should receive 1 dose of Hib vaccine. Regardless of previous immunization, a recipient of a  hematopoietic stem cell transplant should receive a 3-dose series 6-12 months after her successful transplant. Hib vaccine is not recommended for adults with HIV infection. Preventive Services / Frequency Ages 39 to 11 years  Blood pressure check.** / Every 3-5 years.  Lipid and cholesterol check.** / Every 5 years beginning at age 6.  Clinical breast exam.** / Every 3 years for women in their 85s and 34s.  BRCA-related cancer risk assessment.** / For women who have family members with a BRCA-related cancer (breast, ovarian, tubal, or peritoneal cancers).  Pap test.** / Every 2 years from ages 37 through 57. Every 3 years starting at age 51 through age 78 or 70 with a history of 3 consecutive normal Pap tests.  HPV screening.** / Every 3 years from ages 29 through ages 20 to 75 with a history of 3 consecutive normal Pap tests.  Hepatitis C blood test.** / For any individual with known risks for hepatitis C.  Skin self-exam. / Monthly.  Influenza vaccine. / Every year.  Tetanus, diphtheria, and acellular pertussis (Tdap, Td) vaccine.** / Consult your health care provider. Pregnant women should receive 1 dose of Tdap vaccine during each pregnancy. 1 dose of Td every 10 years.  Varicella vaccine.** / Consult your health care provider. Pregnant females who do not have evidence of immunity should receive the first dose after pregnancy.  HPV vaccine. / 3 doses over 6 months, if 38 and younger. The vaccine is not recommended for use in pregnant females. However, pregnancy testing is not needed before receiving a dose.  Measles, mumps, rubella (MMR) vaccine.** / You need at least 1 dose of MMR if you were born in 1957 or later. You may also need a 2nd dose. For females of childbearing age, rubella immunity should be determined. If there is no evidence of immunity, females who are not pregnant should be vaccinated.  If there is no evidence of immunity, females who are pregnant should delay  immunization until after pregnancy.  Pneumococcal 13-valent conjugate (PCV13) vaccine.** / Consult your health care provider.  Pneumococcal polysaccharide (PPSV23) vaccine.** / 1 to 2 doses if you smoke cigarettes or if you have certain conditions.  Meningococcal vaccine.** / 1 dose if you are age 33 to 56 years and a Market researcher living in a residence hall, or have one of several medical conditions, you need to get vaccinated against meningococcal disease. You may also need additional booster doses.  Hepatitis A vaccine.** / Consult your health care provider.  Hepatitis B vaccine.** / Consult your health care provider.  Haemophilus influenzae type b (Hib) vaccine.** / Consult your health care provider. Ages 21 to 15 years  Blood pressure check.** / Every year.  Lipid and cholesterol check.** / Every 5 years beginning at age 46 years.  Lung cancer screening. / Every year if you are aged 3-80 years and have a 30-pack-year history of smoking and currently smoke or have quit within the past 15 years. Yearly screening is stopped once you have quit smoking for at least 15 years or develop a health problem that would prevent you from having lung cancer treatment.  Clinical breast exam.** / Every year after age 59 years.  BRCA-related cancer risk assessment.** / For women who have family members with a BRCA-related cancer (breast, ovarian, tubal, or peritoneal cancers).  Mammogram.** / Every year beginning at age 22 years and continuing for as long as you are in good health. Consult with your health care provider.  Pap test.** / Every 3 years starting at age 81 years through age 34 or 46 years with a history of 3 consecutive normal Pap tests.  HPV screening.** / Every 3 years from ages 36 years through ages 57 to 13 years with a history of 3 consecutive normal Pap tests.  Fecal occult blood test (FOBT) of stool. / Every year beginning at age 31 years and continuing until age  65 years. You may not need to do this test if you get a colonoscopy every 10 years.  Flexible sigmoidoscopy or colonoscopy.** / Every 5 years for a flexible sigmoidoscopy or every 10 years for a colonoscopy beginning at age 22 years and continuing until age 22 years.  Hepatitis C blood test.** / For all people born from [birth year] through 1963 and any individual with known risks for hepatitis C.  Skin self-exam. / Monthly.  Influenza vaccine. / Every year.  Tetanus, diphtheria, and acellular pertussis (Tdap/Td) vaccine.** / Consult your health care provider. Pregnant women should receive 1 dose of Tdap vaccine during each pregnancy. 1 dose of Td every 10 years.  Varicella vaccine.** / Consult your health care provider. Pregnant females who do not have evidence of immunity should receive the first dose after pregnancy.  Zoster vaccine.** / 1 dose for adults aged 76 years or older.  Measles, mumps, rubella (MMR) vaccine.** / You need at least 1 dose of MMR if you were born in 1957 or later. You may also need a second dose. For females of childbearing age, rubella immunity should be determined. If there is no evidence of immunity, females who are not pregnant should be vaccinated. If there is no evidence of immunity, females who are pregnant should delay immunization until after pregnancy.  Pneumococcal 13-valent conjugate (PCV13) vaccine.** / Consult your health care provider.  Pneumococcal polysaccharide (PPSV23) vaccine.** / 1 to 2 doses if you smoke cigarettes  or if you have certain conditions.  Meningococcal vaccine.** / Consult your health care provider.  Hepatitis A vaccine.** / Consult your health care provider.  Hepatitis B vaccine.** / Consult your health care provider.  Haemophilus influenzae type b (Hib) vaccine.** / Consult your health care provider. Ages 37 years and over  Blood pressure check.** / Every year.  Lipid and cholesterol check.** / Every 5 years beginning at age 19  years.  Lung cancer screening. / Every year if you are aged 53-80 years and have a 30-pack-year history of smoking and currently smoke or have quit within the past 15 years. Yearly screening is stopped once you have quit smoking for at least 15 years or develop a health problem that would prevent you from having lung cancer treatment.  Clinical breast exam.** / Every year after age 24 years.  BRCA-related cancer risk assessment.** / For women who have family members with a BRCA-related cancer (breast, ovarian, tubal, or peritoneal cancers).  Mammogram.** / Every year beginning at age 63 years and continuing for as long as you are in good health. Consult with your health care provider.  Pap test.** / Every 3 years starting at age 75 years through age 65 or 53 years with 3 consecutive normal Pap tests. Testing can be stopped between 65 and 70 years with 3 consecutive normal Pap tests and no abnormal Pap or HPV tests in the past 10 years.  HPV screening.** / Every 3 years from ages 13 years through ages 3 or 79 years with a history of 3 consecutive normal Pap tests. Testing can be stopped between 65 and 70 years with 3 consecutive normal Pap tests and no abnormal Pap or HPV tests in the past 10 years.  Fecal occult blood test (FOBT) of stool. / Every year beginning at age 3 years and continuing until age 4 years. You may not need to do this test if you get a colonoscopy every 10 years.  Flexible sigmoidoscopy or colonoscopy.** / Every 5 years for a flexible sigmoidoscopy or every 10 years for a colonoscopy beginning at age 8 years and continuing until age 71 years.  Hepatitis C blood test.** / For all people born from 2 through 1965 and any individual with known risks for hepatitis C.  Osteoporosis screening.** / A one-time screening for women ages 73 years and over and women at risk for fractures or osteoporosis.  Skin self-exam. / Monthly.  Influenza vaccine. / Every year.  Tetanus,  diphtheria, and acellular pertussis (Tdap/Td) vaccine.** / 1 dose of Td every 10 years.  Varicella vaccine.** / Consult your health care provider.  Zoster vaccine.** / 1 dose for adults aged 15 years or older.  Pneumococcal 13-valent conjugate (PCV13) vaccine.** / Consult your health care provider.  Pneumococcal polysaccharide (PPSV23) vaccine.** / 1 dose for all adults aged 63 years and older.  Meningococcal vaccine.** / Consult your health care provider.  Hepatitis A vaccine.** / Consult your health care provider.  Hepatitis B vaccine.** / Consult your health care provider.  Haemophilus influenzae type b (Hib) vaccine.** / Consult your health care provider. ** Family history and personal history of risk and conditions may change your health care provider's recommendations.   This information is not intended to replace advice given to you by your health care provider. Make sure you discuss any questions you have with your health care provider.   Document Released: 02/05/2002 Document Revised: 12/31/2014 Document Reviewed: 05/07/2011 Elsevier Interactive Patient Education [employer].

## 2016-03-06 NOTE — Progress Notes (Signed)
Pre visit review using our clinic review tool, if applicable. No additional management support is needed unless otherwise documented below in the visit note. 

## 2016-03-06 NOTE — Assessment & Plan Note (Signed)
labs

## 2016-06-28 DIAGNOSIS — Z1231 Encounter for screening mammogram for malignant neoplasm of breast: Secondary | ICD-10-CM | POA: Diagnosis not present

## 2016-07-11 DIAGNOSIS — Z124 Encounter for screening for malignant neoplasm of cervix: Secondary | ICD-10-CM | POA: Diagnosis not present

## 2016-07-12 ENCOUNTER — Other Ambulatory Visit: Payer: Self-pay | Admitting: Obstetrics and Gynecology

## 2016-07-12 DIAGNOSIS — E2839 Other primary ovarian failure: Secondary | ICD-10-CM

## 2016-07-17 DIAGNOSIS — N393 Stress incontinence (female) (male): Secondary | ICD-10-CM | POA: Insufficient documentation

## 2016-08-01 ENCOUNTER — Ambulatory Visit
Admission: RE | Admit: 2016-08-01 | Discharge: 2016-08-01 | Disposition: A | Discharge status: Home / Self Care | Payer: Medicare Other | Source: Ambulatory Visit | Attending: Obstetrics and Gynecology | Admitting: Obstetrics and Gynecology

## 2016-08-01 DIAGNOSIS — Z780 Asymptomatic menopausal state: Secondary | ICD-10-CM | POA: Diagnosis not present

## 2016-08-01 DIAGNOSIS — Z13820 Encounter for screening for osteoporosis: Secondary | ICD-10-CM | POA: Diagnosis not present

## 2016-08-01 DIAGNOSIS — E2839 Other primary ovarian failure: Secondary | ICD-10-CM

## 2016-09-18 DIAGNOSIS — Z23 Encounter for immunization: Secondary | ICD-10-CM | POA: Diagnosis not present

## 2016-11-08 ENCOUNTER — Other Ambulatory Visit: Payer: Self-pay | Admitting: Internal Medicine

## 2016-11-14 NOTE — Telephone Encounter (Signed)
Done

## 2017-03-01 ENCOUNTER — Telehealth: Payer: Self-pay | Admitting: Internal Medicine

## 2017-03-01 NOTE — Telephone Encounter (Signed)
Called patient to schedule awv. Left msg for patient to call office to schedule appt.  °

## 2017-03-20 NOTE — Progress Notes (Signed)
Subjective:   Olivia Odom is a 71 y.o. female who presents for Medicare Annual (Subsequent) preventive examination.  Review of Systems:  No ROS.  Medicare Wellness Visit.  Cardiac Risk Factors include: advanced age (>72men, >68 women);dyslipidemia;family history of premature cardiovascular disease Sleep patterns: feels rested on waking, gets up 1 times nightly to void and sleeps 6 hours nightly. Reports history of insomnia Home Safety/Smoke Alarms:  Feels safe in home. Smoke alarms in place.   Living environment; residence and Firearm Safety: 2-story house, no firearms. Seat Belt Safety/Bike Helmet: Wears seat belt.   Counseling:   Eye Exam- yearly appointment, Dr. Gershon [facility]- quarterly appointments, Dr. Jillyn Hidden  Female:   Pap- N/A       Mammo- Last  04/23/16        Dexa scan- Last 08/01/16  CCS- Last 08/19/14      Objective:     Vitals: BP 118/78   Pulse 72   Resp 20   Ht 5\' 2"  (1.575 m)   Wt 144 lb (65.3 kg)   SpO2 99%   BMI 26.34 kg/m   Body mass index is 26.34 kg/m.   Tobacco History  Smoking Status  . Never Smoker  Smokeless Tobacco  . Never Used     Counseling given: Not Answered   Past Medical History:  Diagnosis Date  . Hyperlipidemia    Past Surgical History:  Procedure Laterality Date  . BREAST CYST EXCISION     college  . OVARIAN CYST REMOVAL  college   Family History  Problem Relation Age of Onset  . Cancer Mother 76    breast cancer  . Heart disease Father 46    MI  . Parkinson's disease Father    History  Sexual Activity  . Sexual activity: Yes    Outpatient Encounter Prescriptions as of 03/21/2017  Medication Sig  . cholecalciferol (VITAMIN D) 1000 UNITS tablet Take 1,000 Units by mouth daily.    . Omega-3 Fatty Acids (FISH OIL PO) Take 5 mLs by mouth.  . zolpidem (AMBIEN) 10 MG tablet TAKE ONE-HALF TO ONE TABLET BY MOUTH AT BEDTIME AS NEEDED FOR SLEEP  . [DISCONTINUED] loratadine (CLARITIN) 10 MG tablet Take 1 tablet  (10 mg total) by mouth daily. (Patient not taking: Reported on 03/06/2016)  . [DISCONTINUED] losartan (COZAAR) 100 MG tablet Take 1 tablet (100 mg total) by mouth daily. (Patient not taking: Reported on 03/06/2016)  . [DISCONTINUED] vitamin E ([facility] VITAMIN E) 400 UNIT capsule Take 1 capsule (400 Units total) by mouth daily.   No facility-administered encounter medications on file as of 03/21/2017.     Activities of Daily Living In your present state of health, do you have any difficulty performing the following activities: 03/21/2017  Hearing? N  Vision? N  Difficulty concentrating or making decisions? N  Walking or climbing stairs? N  Dressing or bathing? N  Doing errands, shopping? N  Preparing Food and eating ? N  Using the Toilet? N  In the past six months, have you accidently leaked urine? N  Do you have problems with loss of bowel control? N  Managing your Medications? N  Managing your Finances? N  Housekeeping or managing your Housekeeping? N  Some recent data might be hidden    Patient Care Team: Cassandria Anger, MD as PCP - General (Internal Medicine) Gus Height, MD (Obstetrics and Gynecology) Rutherford Guys, MD (Ophthalmology) Richmond Campbell, MD as Consulting Physician (Gastroenterology)    Assessment:  Physical assessment deferred to PCP.  Exercise Activities and Dietary recommendations Current Exercise Habits: Home exercise routine (reports being very active generally during the course of the day), Type of exercise: walking, Time (Minutes): 30, Frequency (Times/Week): 4, Weekly Exercise (Minutes/Week): 120, Intensity: Mild, Exercise limited by: None identified  Diet (meal preparation, eat out, water intake, caffeinated beverages, dairy products, fruits and vegetables): in general, a "healthy" diet  , low fat/ cholesterol, low salt, limits sugary drinks and caffeine. Reports drinking 4-5 glasses of water per day.   Encouraged patient to continue to consume a healthy  diet and to increase her daily water intake.    Goals    . increase daily water intake.          Keep a cup of water by my side and refill the cup often.      Fall Risk Fall Risk  03/21/2017 03/06/2016  Falls in the past year? No No   Depression Screen PHQ 2/9 Scores 03/21/2017 03/06/2016  PHQ - 2 Score 0 0     Cognitive Function       Ad8 score reviewed for issues:  Issues making decisions: no  Less interest in hobbies / activities: no  Repeats questions, stories (family complaining): no  Trouble using ordinary gadgets (microwave, computer, phone): no  Forgets the month or year: no  Mismanaging finances: no  Remembering appts: no  Daily problems with thinking and/or memory: no Ad8 score is= 0     Immunization History  Administered Date(s) Administered  . H1N1 12/01/2008  . Influenza Split 09/12/2012  . Influenza Whole 09/27/2009  . Influenza, High Dose Seasonal PF 09/18/2016  . Influenza-Unspecified 08/24/2013, 08/24/2014, 08/25/2015  . Pneumococcal Conjugate-13 12/29/2014  . Pneumococcal Polysaccharide-23 02/26/2014  . Tdap 05/28/2011  . Zoster 05/28/2011   Screening Tests Health Maintenance  Topic Date Due  . Hepatitis C Screening  03/21/2018 (Originally [birth year])  . COLONOSCOPY  07/25/2019 (Originally 08/19/2016)  . MAMMOGRAM  04/23/2017  . TETANUS/TDAP  05/27/2021  . INFLUENZA VACCINE  Completed  . DEXA SCAN  Completed  . PNA vac Low Risk Adult  Completed      Plan:     Continue to eat heart healthy diet (full of fruits, vegetables, whole grains, lean protein, water--limit salt, fat, and sugar intake) and increase physical activity as tolerated.  Continue doing brain stimulating activities (puzzles, reading, adult coloring books, staying active) to keep memory sharp.   During the course of the visit the patient was educated and counseled about the following appropriate screening and preventive services:   Vaccines to include Pneumoccal,  Influenza, Hepatitis B, Td, Zostavax, HCV  Cardiovascular Disease  Colorectal cancer screening  Bone density screening  Diabetes screening  Glaucoma screening  Mammography/PAP  Nutrition counseling   Patient Instructions (the written plan) was given to the patient.   Michiel Cowboy, RN  03/21/2017

## 2017-03-20 NOTE — Progress Notes (Signed)
Pre visit review using our clinic review tool, if applicable. No additional management support is needed unless otherwise documented below in the visit note. 

## 2017-03-21 ENCOUNTER — Telehealth: Payer: Self-pay | Admitting: *Deleted

## 2017-03-21 ENCOUNTER — Ambulatory Visit (INDEPENDENT_AMBULATORY_CARE_PROVIDER_SITE_OTHER): Payer: Medicare Other | Admitting: *Deleted

## 2017-03-21 VITALS — BP 118/78 | HR 72 | Resp 20 | Ht 62.0 in | Wt 144.0 lb

## 2017-03-21 DIAGNOSIS — Z0000 Encounter for general adult medical examination without abnormal findings: Secondary | ICD-10-CM | POA: Diagnosis not present

## 2017-03-21 DIAGNOSIS — E559 Vitamin D deficiency, unspecified: Secondary | ICD-10-CM

## 2017-03-21 DIAGNOSIS — G4700 Insomnia, unspecified: Secondary | ICD-10-CM

## 2017-03-21 DIAGNOSIS — E785 Hyperlipidemia, unspecified: Secondary | ICD-10-CM

## 2017-03-21 NOTE — Telephone Encounter (Signed)
During patient's AWV she requested a refill of her ambien prescription. She also reported that she did not get her labs drawn that you ordered from her last CPE visit. She wanted to know if those could be ordered for her again. She has been called and a VM was left advising her to schedule an appointment with you as it has been an year since her previous PCP visit. Nurse will f/u and schedule an appointment.

## 2017-03-21 NOTE — Patient Instructions (Addendum)
Continue to eat heart healthy diet (full of fruits, vegetables, whole grains, lean protein, water--limit salt, fat, and sugar intake) and increase physical activity as tolerated.  Continue doing brain stimulating activities (puzzles, reading, adult coloring books, staying active) to keep memory sharp.   Olivia Odom , Thank you for taking time to come for your Medicare Wellness Visit. I appreciate your ongoing commitment to your health goals. Please review the following plan we discussed and let me know if I can assist you in the future.   These are the goals we discussed: Goals    . increase daily water intake.          Keep a cup of water by my side and refill the cup often.       This is a list of the screening recommended for you and due dates:  Health Maintenance  Topic Date Due  .  Hepatitis C: One time screening is recommended by Center for Disease Control  (CDC) for  adults born from [birth year] through 1963.   June 01, 1946  . Colon Cancer Screening  08/19/2016  . Mammogram  04/23/2017  . Tetanus Vaccine  05/27/2021  . Flu Shot  Completed  . DEXA scan (bone density measurement)  Completed  . Pneumonia vaccines  Completed

## 2017-03-21 NOTE — Telephone Encounter (Signed)
Called patient to notify that she will need to make an appointment to see Dr. Alain Marion for receive her medication refill. Her last physician visit was 03/06/16. Requested that the patient call back to schedule an appointment.

## 2017-03-21 NOTE — Telephone Encounter (Signed)
Patient called back and an office visit was scheduled for 04/04/17. Nurse will call patient back to inform if labs will be drawn before appointment or the day of and to update regarding prescription refill request.

## 2017-03-23 NOTE — Telephone Encounter (Signed)
Ok CBC, BMET, LFT, TSH, UA, Vit D, Lipids Ok to ref [employer]

## 2017-03-25 ENCOUNTER — Other Ambulatory Visit: Payer: Self-pay | Admitting: *Deleted

## 2017-03-25 MED ORDER — ZOLPIDEM TARTRATE 10 MG PO TABS: ORAL_TABLET | ORAL | 3 refills | Status: DC

## 2017-03-25 NOTE — Telephone Encounter (Signed)
Called patient to inform that her prescription was refilled and faxed to pharmacy and lab orders were placed for her to have drawn before her upcoming appointment with PCP

## 2017-03-27 ENCOUNTER — Other Ambulatory Visit (INDEPENDENT_AMBULATORY_CARE_PROVIDER_SITE_OTHER): Payer: Medicare Other

## 2017-03-27 DIAGNOSIS — E785 Hyperlipidemia, unspecified: Secondary | ICD-10-CM | POA: Diagnosis not present

## 2017-03-27 DIAGNOSIS — Z0000 Encounter for general adult medical examination without abnormal findings: Secondary | ICD-10-CM

## 2017-03-27 DIAGNOSIS — G4700 Insomnia, unspecified: Secondary | ICD-10-CM

## 2017-03-27 DIAGNOSIS — E559 Vitamin D deficiency, unspecified: Secondary | ICD-10-CM

## 2017-03-27 LAB — URINALYSIS, ROUTINE W REFLEX MICROSCOPIC
Bilirubin Urine: NEGATIVE
Hgb urine dipstick: NEGATIVE
Ketones, ur: NEGATIVE
Nitrite: NEGATIVE
PH: 6 (ref 5.0–8.0)
SPECIFIC GRAVITY, URINE: 1.02 (ref 1.000–1.030)
Total Protein, Urine: NEGATIVE
Urine Glucose: NEGATIVE
Urobilinogen, UA: 0.2 (ref 0.0–1.0)

## 2017-03-27 LAB — CBC
HEMATOCRIT: 43.5 % (ref 36.0–46.0)
Hemoglobin: 14.8 g/dL (ref 12.0–15.0)
MCHC: 34.1 g/dL (ref 30.0–36.0)
MCV: 91.2 fl (ref 78.0–100.0)
Platelets: 273 10*3/uL (ref 150.0–400.0)
RBC: 4.77 Mil/uL (ref 3.87–5.11)
RDW: 13.2 % (ref 11.5–15.5)
WBC: 5.1 10*3/uL (ref 4.0–10.5)

## 2017-03-27 LAB — LIPID PANEL
CHOL/HDL RATIO: 4
Cholesterol: 263 mg/dL — ABNORMAL HIGH (ref 0–200)
HDL: 59.1 mg/dL (ref >39.00)
LDL CALC: 187 mg/dL — AB (ref 0–99)
NonHDL: 204.39
Triglycerides: 88 mg/dL (ref 0.0–149.0)
VLDL: 17.6 mg/dL (ref 0.0–40.0)

## 2017-03-27 LAB — HEPATIC FUNCTION PANEL
ALBUMIN: 4.3 g/dL (ref 3.5–5.2)
ALK PHOS: 78 U/L (ref 39–117)
ALT: 17 U/L (ref 0–35)
AST: 18 U/L (ref 0–37)
BILIRUBIN TOTAL: 0.4 mg/dL (ref 0.2–1.2)
Bilirubin, Direct: 0.1 mg/dL (ref 0.0–0.3)
Total Protein: 7.2 g/dL (ref 6.0–8.3)

## 2017-03-27 LAB — BASIC METABOLIC PANEL
BUN: 12 mg/dL (ref 6–23)
CALCIUM: 9.6 mg/dL (ref 8.4–10.5)
CO2: 29 mEq/L (ref 19–32)
Chloride: 105 mEq/L (ref 96–112)
Creatinine, Ser: 0.94 mg/dL (ref 0.40–1.20)
GFR: 62.81 mL/min (ref >60.00)
GLUCOSE: 107 mg/dL — AB (ref 70–99)
POTASSIUM: 4.6 meq/L (ref 3.5–5.1)
SODIUM: 141 meq/L (ref 135–145)

## 2017-03-27 LAB — TSH: TSH: 1.22 u[IU]/mL (ref 0.35–4.50)

## 2017-03-29 LAB — VITAMIN D 1,25 DIHYDROXY
VITAMIN D 1, 25 (OH) TOTAL: 29 pg/mL (ref 18–72)
Vitamin D2 1, 25 (OH)2: <8 pg/mL
Vitamin D3 1, 25 (OH)2: 29 pg/mL

## 2017-04-04 ENCOUNTER — Ambulatory Visit (INDEPENDENT_AMBULATORY_CARE_PROVIDER_SITE_OTHER): Payer: Medicare Other | Admitting: Internal Medicine

## 2017-04-04 ENCOUNTER — Encounter: Payer: Self-pay | Admitting: Internal Medicine

## 2017-04-04 DIAGNOSIS — E559 Vitamin D deficiency, unspecified: Secondary | ICD-10-CM | POA: Diagnosis not present

## 2017-04-04 DIAGNOSIS — R1012 Left upper quadrant pain: Secondary | ICD-10-CM | POA: Diagnosis not present

## 2017-04-04 DIAGNOSIS — N390 Urinary tract infection, site not specified: Secondary | ICD-10-CM | POA: Diagnosis not present

## 2017-04-04 MED ORDER — CIPROFLOXACIN HCL 500 MG PO TABS: 500.0000 mg | ORAL_TABLET | Freq: Two times a day (BID) | ORAL | 0 refills | Status: DC

## 2017-04-04 NOTE — Assessment & Plan Note (Signed)
[place] Treat UTI

## 2017-04-04 NOTE — Progress Notes (Signed)
Pre visit review using our clinic review tool, if applicable. No additional management support is needed unless otherwise documented below in the visit note. 

## 2017-04-04 NOTE — Progress Notes (Signed)
Subjective:  Patient ID: Olivia Odom, female    DOB: 07-30-1946  Age: 71 y.o. MRN: [id number]  CC: No chief complaint on file.   HPI Olivia Odom presents for insomnia C/o L lower lat ribs worse w/eating x2 wks  Outpatient Medications Prior to Visit  Medication Sig Dispense Refill  . cholecalciferol (VITAMIN D) 1000 UNITS tablet Take 1,000 Units by mouth daily.      . Omega-3 Fatty Acids (FISH OIL PO) Take 5 mLs by mouth.    . zolpidem (AMBIEN) 10 MG tablet TAKE ONE-HALF TO ONE TABLET BY MOUTH AT BEDTIME AS NEEDED FOR SLEEP 30 tablet 3   No facility-administered medications prior to visit.     ROS Review of Systems  Constitutional: Negative for activity change, appetite change, chills, fatigue and unexpected weight change.  HENT: Negative for congestion, mouth sores and sinus pressure.   Eyes: Negative for visual disturbance.  Respiratory: Negative for cough and chest tightness.   Gastrointestinal: Negative for abdominal pain and nausea.  Genitourinary: Negative for difficulty urinating, frequency and vaginal pain.  Musculoskeletal: Negative for back pain and gait problem.  Skin: Negative for pallor and rash.  Neurological: Negative for dizziness, tremors, weakness, numbness and headaches.  Psychiatric/Behavioral: Negative for confusion, sleep disturbance and suicidal ideas.    Objective:  BP 138/82 (BP Location: Left Arm, Patient Position: Sitting, Cuff Size: Normal)   Pulse 72   Temp 98.5 F (36.9 C) (Oral)   Ht 5\' 2"  (1.575 m)   Wt 144 lb 0.6 oz (65.3 kg)   SpO2 99%   BMI 26.35 kg/m   BP Readings from Last 3 Encounters:  04/04/17 138/82  03/21/17 118/78  03/06/16 (!) 158/90    Wt Readings from Last 3 Encounters:  04/04/17 144 lb 0.6 oz (65.3 kg)  03/21/17 144 lb (65.3 kg)  03/06/16 146 lb (66.2 kg)    Physical Exam  Lab Results  Component Value Date   WBC 5.1 03/27/2017   HGB 14.8 03/27/2017   HCT 43.5 03/27/2017   PLT 273.0 03/27/2017   GLUCOSE 107 (H) 03/27/2017   CHOL 263 (H) 03/27/2017   TRIG 88.0 03/27/2017   HDL 59.10 03/27/2017   LDLDIRECT 160.7 01/09/2012   LDLCALC 187 (H) 03/27/2017   ALT 17 03/27/2017   AST 18 03/27/2017   NA 141 03/27/2017   K 4.6 03/27/2017   CL 105 03/27/2017   CREATININE 0.94 03/27/2017   BUN 12 03/27/2017   CO2 29 03/27/2017   TSH 1.22 03/27/2017    Dg Bone Density  Result Date: 08/01/2016 EXAM: DUAL X-RAY ABSORPTIOMETRY (DXA) FOR BONE MINERAL DENSITY IMPRESSION: Referring Physician:  Allyn Kenner PATIENT: Name: Olivia Odom, Olivia Odom Patient ID: [id number] Birth Date: October 01, 1946 Height: 62.5 in. Sex: Female Measured: 08/01/2016 Weight: 140.0 lbs. Indications: Caucasian, Estrogen Deficient, Height Loss (781.91), Low Calcium Intake (269.3), Postmenopausal Fractures: None Treatments: Vitamin D (E933.5) ASSESSMENT: The BMD measured at Femur Neck Left is 0.960 g/cm2 with a T-score of -0.6. This patient is considered normal according to [facility]) criteria. This patient does not meet criteria for FRAX assessment. Site Region Measured Date Measured Age YA BMD Significant CHANGE T-score DualFemur Neck Left 10/01/2016    70.0         -0.6    0.960 g/cm2 AP Spine  L1-L4     08/01/2016    70.0         0.5     1.254 g/cm2 World Health [facility]) criteria  for post-menopausal, Caucasian Women: Normal       T-score at or above -1 SD Osteopenia   T-score between -1 and -2.5 SD Osteoporosis T-score at or below -2.5 SD RECOMMENDATION: [place] recommends that FDA-approved medical therapies be considered in postmenopausal women and men age 26 or older with a: 1. Hip or vertebral (clinical or morphometric) fracture. 2. T-score of <-2.5 at the spine or hip. 3. Ten-year fracture probability by FRAX of 3% or greater for hip fracture or 20% or greater for major osteoporotic fracture. All treatment decisions require clinical judgment and consideration of individual patient  factors, including patient preferences, co-morbidities, previous drug use, risk factors not captured in the FRAX model (e.g. falls, vitamin D deficiency, increased bone turnover, interval significant decline in bone density) and possible under - or over-estimation of fracture risk by FRAX. All patients should ensure an adequate intake of dietary calcium (1200 mg/d) and vitamin D (800 IU daily) unless contraindicated. FOLLOW-UP: People with diagnosed cases of osteoporosis or at high risk for fracture should have regular bone mineral density tests. For patients eligible for Medicare, routine testing is allowed once every 2 years. The testing frequency can be increased to one year for patients who have rapidly progressing disease, those who are receiving or discontinuing medical therapy to restore bone mass, or have additional risk factors. I have reviewed this report, and agree with the above findings. [facility] Electronically Signed   By: Lahoma Crocker M.D.   On: 08/01/2016 08:44    Assessment & Plan:   There are no diagnoses linked to this encounter. I am having Olivia Odom maintain her cholecalciferol, Omega-3 Fatty Acids (FISH OIL PO), and zolpidem.  No orders of the defined types were placed in this encounter.    Follow-up: No Follow-up on file.  Walker Kehr, MD

## 2017-04-04 NOTE — Assessment & Plan Note (Signed)
Cipro

## 2017-04-04 NOTE — Assessment & Plan Note (Signed)
On Vit D 

## 2017-04-22 ENCOUNTER — Other Ambulatory Visit: Payer: Self-pay | Admitting: Internal Medicine

## 2017-04-22 ENCOUNTER — Ambulatory Visit
Admission: RE | Admit: 2017-04-22 | Discharge: 2017-04-22 | Disposition: A | Discharge status: Home / Self Care | Payer: Medicare Other | Source: Ambulatory Visit | Attending: Internal Medicine | Admitting: Internal Medicine

## 2017-04-22 DIAGNOSIS — N390 Urinary tract infection, site not specified: Secondary | ICD-10-CM

## 2017-04-22 DIAGNOSIS — R1012 Left upper quadrant pain: Secondary | ICD-10-CM | POA: Diagnosis not present

## 2017-04-22 DIAGNOSIS — N2889 Other specified disorders of kidney and ureter: Secondary | ICD-10-CM

## 2017-04-23 ENCOUNTER — Inpatient Hospital Stay: Admission: RE | Admit: 2017-04-23 | Payer: Medicare Other | Source: Ambulatory Visit

## 2017-04-24 ENCOUNTER — Ambulatory Visit (INDEPENDENT_AMBULATORY_CARE_PROVIDER_SITE_OTHER)
Admission: RE | Admit: 2017-04-24 | Discharge: 2017-04-24 | Disposition: A | Discharge status: Home / Self Care | Payer: Medicare Other | Source: Ambulatory Visit | Attending: Internal Medicine | Admitting: Internal Medicine

## 2017-04-24 DIAGNOSIS — N2889 Other specified disorders of kidney and ureter: Secondary | ICD-10-CM | POA: Diagnosis not present

## 2017-04-24 DIAGNOSIS — R1012 Left upper quadrant pain: Secondary | ICD-10-CM

## 2017-04-24 MED ORDER — IOPAMIDOL (ISOVUE-370) INJECTION 76%
100.0000 mL | Freq: Once | INTRAVENOUS | Status: AC | PRN
  Administered 2017-04-24: 100 mL via INTRAVENOUS

## 2017-07-16 DIAGNOSIS — Z1289 Encounter for screening for malignant neoplasm of other sites: Secondary | ICD-10-CM | POA: Diagnosis not present

## 2017-07-16 DIAGNOSIS — Z1231 Encounter for screening mammogram for malignant neoplasm of breast: Secondary | ICD-10-CM | POA: Diagnosis not present

## 2017-08-12 ENCOUNTER — Telehealth: Payer: Self-pay | Admitting: Internal Medicine

## 2017-08-12 MED ORDER — ZOLPIDEM TARTRATE 10 MG PO TABS: ORAL_TABLET | ORAL | 5 refills | Status: DC

## 2017-08-12 NOTE — Telephone Encounter (Signed)
OK to fill this/these prescription(s) with additional refills x6 mo Needs to have an OV every 6 months Thank you!

## 2017-08-12 NOTE — Telephone Encounter (Signed)
Notified pt [facility] had to leave on pharmacy vm.Pt states she also want to inform that she had her mammogram back in July at [facility] was trying to updated it on mychart but it would not take. inform pt will update../lmb./lmb

## 2017-08-12 NOTE — Telephone Encounter (Signed)
Pt called wanting a refill of her zolpidem (AMBIEN) 10 MG tablet  She states she left her bottle in [place] last week  Please advise  [facility] [address]

## 2017-09-06 DIAGNOSIS — Z23 Encounter for immunization: Secondary | ICD-10-CM | POA: Diagnosis not present

## 2017-12-17 DIAGNOSIS — H0015 Chalazion left lower eyelid: Secondary | ICD-10-CM | POA: Diagnosis not present

## 2017-12-26 DIAGNOSIS — H00025 Hordeolum internum left lower eyelid: Secondary | ICD-10-CM | POA: Diagnosis not present

## 2017-12-30 ENCOUNTER — Ambulatory Visit: Payer: Medicare Other | Admitting: Internal Medicine

## 2018-01-16 ENCOUNTER — Encounter: Payer: Self-pay | Admitting: Internal Medicine

## 2018-01-16 ENCOUNTER — Ambulatory Visit (INDEPENDENT_AMBULATORY_CARE_PROVIDER_SITE_OTHER): Payer: Medicare Other | Admitting: Internal Medicine

## 2018-01-16 DIAGNOSIS — I10 Essential (primary) hypertension: Secondary | ICD-10-CM

## 2018-01-16 DIAGNOSIS — E785 Hyperlipidemia, unspecified: Secondary | ICD-10-CM | POA: Diagnosis not present

## 2018-01-16 DIAGNOSIS — E559 Vitamin D deficiency, unspecified: Secondary | ICD-10-CM

## 2018-01-16 MED ORDER — ZOLPIDEM TARTRATE 10 MG PO TABS: ORAL_TABLET | ORAL | 5 refills | Status: DC

## 2018-01-16 MED ORDER — ONDANSETRON HCL 4 MG PO TABS: 4.0000 mg | ORAL_TABLET | Freq: Three times a day (TID) | ORAL | 0 refills | Status: DC | PRN

## 2018-01-16 MED ORDER — LOSARTAN POTASSIUM 50 MG PO TABS
50.0000 mg | ORAL_TABLET | Freq: Every day | ORAL | 3 refills | Status: DC
Start: 2018-01-16 — End: 2019-01-28

## 2018-01-16 NOTE — Assessment & Plan Note (Signed)
Labs

## 2018-01-16 NOTE — Assessment & Plan Note (Signed)
Vit D 

## 2018-01-16 NOTE — Progress Notes (Signed)
Subjective:  Patient ID: Olivia Odom, female    DOB: [birth year]  Age: 72 y.o. MRN: [id number]  CC: No chief complaint on file.   HPI Olivia Odom presents for elevated BP She had a chalazion, went to ER - BP 180/90: 12/18/17 Dr Gershon Crane gave eye drops  F/u insomnia C/o recent GI virus in the family  Outpatient Medications Prior to Visit  Medication Sig Dispense Refill  . cholecalciferol (VITAMIN D) 1000 UNITS tablet Take 1,000 Units by mouth daily.      . Omega-3 Fatty Acids (FISH OIL PO) Take 5 mLs by mouth.    . zolpidem (AMBIEN) 10 MG tablet TAKE ONE-HALF TO ONE TABLET BY MOUTH AT BEDTIME AS NEEDED FOR SLEEP 30 tablet 5  . ciprofloxacin (CIPRO) 500 MG tablet Take 1 tablet (500 mg total) by mouth 2 (two) times daily. 14 tablet 0   No facility-administered medications prior to visit.     ROS Review of Systems  Constitutional: Negative for activity change, appetite change, chills, fatigue and unexpected weight change.  HENT: Negative for congestion, mouth sores and sinus pressure.   Eyes: Negative for visual disturbance.  Respiratory: Negative for cough and chest tightness.   Gastrointestinal: Negative for abdominal pain and nausea.  Genitourinary: Negative for difficulty urinating, frequency and vaginal pain.  Musculoskeletal: Negative for back pain and gait problem.  Skin: Negative for pallor and rash.  Neurological: Negative for dizziness, tremors, weakness, numbness and headaches.  Psychiatric/Behavioral: Positive for sleep disturbance. Negative for confusion and suicidal ideas.    Objective:  BP 138/68 (BP Location: Left Arm, Patient Position: Sitting, Cuff Size: Small)   Pulse 79   Temp 98.3 F (36.8 C) (Oral)   Ht 5\' 2"  (1.575 m)   Wt 149 lb 1.9 oz (67.6 kg)   SpO2 97%   BMI 27.27 kg/m   BP Readings from Last 3 Encounters:  01/16/18 138/68  04/04/17 138/82  03/21/17 118/78    Wt Readings from Last 3 Encounters:  01/16/18 149 lb 1.9 oz (67.6 kg)    04/04/17 144 lb 0.6 oz (65.3 kg)  03/21/17 144 lb (65.3 kg)    Physical Exam  Constitutional: She appears well-developed. No distress.  HENT:  Head: Normocephalic.  Right Ear: External ear normal.  Left Ear: External ear normal.  Nose: Nose normal.  Mouth/Throat: Oropharynx is clear and moist.  Eyes: Conjunctivae are normal. Pupils are equal, round, and reactive to light. Right eye exhibits no discharge. Left eye exhibits no discharge.  Neck: Normal range of motion. Neck supple. No JVD present. No tracheal deviation present. No thyromegaly present.  Cardiovascular: Normal rate, regular rhythm and normal heart sounds.  Pulmonary/Chest: No stridor. No respiratory distress. She has no wheezes.  Abdominal: Soft. Bowel sounds are normal. She exhibits no distension and no mass. There is no tenderness. There is no rebound and no guarding.  Musculoskeletal: She exhibits no edema or tenderness.  Lymphadenopathy:    She has no cervical adenopathy.  Neurological: She displays normal reflexes. No cranial nerve deficit. She exhibits normal muscle tone. Coordination normal.  Skin: No rash noted. No erythema.  Psychiatric: She has a normal mood and affect. Her behavior is normal. Judgment and thought content normal.    Lab Results  Component Value Date   WBC 5.1 03/27/2017   HGB 14.8 03/27/2017   HCT 43.5 03/27/2017   PLT 273.0 03/27/2017   GLUCOSE 107 (H) 03/27/2017   CHOL 263 (H) 03/27/2017   TRIG 88.0  03/27/2017   HDL 59.10 03/27/2017   LDLDIRECT 160.7 01/09/2012   LDLCALC 187 (H) 03/27/2017   ALT 17 03/27/2017   AST 18 03/27/2017   NA 141 03/27/2017   K 4.6 03/27/2017   CL 105 03/27/2017   CREATININE 0.94 03/27/2017   BUN 12 03/27/2017   CO2 29 03/27/2017   TSH 1.22 03/27/2017    Ct Abdomen Pelvis W Wo Contrast  Result Date: 04/24/2017 CLINICAL DATA:  72 year old female with intermittent left upper quadrant tenderness for the past 5 months. Right renal mass noted on recent  ultrasound examination. Followup study. EXAM: CT ABDOMEN AND PELVIS WITHOUT AND WITH CONTRAST TECHNIQUE: Multidetector CT imaging of the abdomen and pelvis was performed following the standard protocol before and following the bolus administration of intravenous contrast. CONTRAST:  100 mL of Isovue 370. COMPARISON:  No prior abdominal CT. Abdominal ultrasound 04/22/2017. FINDINGS: Lower chest: Areas of mild scarring are noted throughout the visualize lung bases. Hepatobiliary: In the inferior aspect of segment 6 (axial image 43 of series 7) there is an ill-defined hypoattenuating lesion measuring 1.8 cm which is indeterminate. This may demonstrate some peripheral nodular enhancement, but this is not definitive. No other larger cystic or solid hepatic lesions. No intra or extrahepatic biliary ductal dilatation. Gallbladder is normal in appearance. Pancreas: No pancreatic mass. No pancreatic ductal dilatation. No pancreatic or peripancreatic fluid or inflammatory changes. Spleen: Spleen is normal in appearance. Adrenals/Urinary Tract: There is an exophytic fatty attenuation lesion in the lateral aspect of the right kidney measuring approximately 1.6 cm in diameter (axial image 40 of series 7), compatible with an angiomyolipoma. No other suspicious renal lesions are noted. No hydroureteronephrosis. Urinary bladder is normal in appearance. Bilateral adrenal glands are normal in appearance. Stomach/Bowel: No pathologic dilatation of small bowel or colon. The appendix is not confidently identified and may be surgically absent. Regardless, there are no inflammatory changes noted adjacent to the cecum to suggest the presence of an acute appendicitis at this time. Vascular/Lymphatic: Aortic atherosclerosis, without evidence of aneurysm or dissection in the abdominal or pelvic vasculature. No lymphadenopathy noted in the abdomen or pelvis. Reproductive: Uterus is retroflexed. Ovaries are unremarkable in appearance. Other: No  significant volume of ascites.  No pneumoperitoneum. Musculoskeletal: There are no aggressive appearing lytic or blastic lesions noted in the visualized portions of the skeleton. IMPRESSION: 1. 1.6 cm fatty attenuation right renal lesion compatible with an angiomyolipoma. 2. No acute findings in the abdomen or pelvis to account for the patient's history of left upper quadrant pain. 3. 1.8 cm indeterminate lesion in the inferior aspect of segment 6 of the liver. Overall, this is favored to represent a benign lesion, potentially a focal area of fatty infiltration, or a small cavernous hemangioma, but this is indeterminate on today's examination. If further characterization is desired, or if there is a history of primary malignancy with concern for metastatic disease to the liver, further characterization with MRI of the abdomen with and without IV gadolinium could be considered. 4. Aortic atherosclerosis. Electronically Signed   By: Vinnie Langton M.D.   On: 04/24/2017 09:27    Assessment & Plan:   There are no diagnoses linked to this encounter. I am having Olivia Odom maintain her cholecalciferol, Omega-3 Fatty Acids (FISH OIL PO), ciprofloxacin, and zolpidem.  No orders of the defined types were placed in this encounter.    Follow-up: No Follow-up on file.  Walker Kehr, MD

## 2018-01-16 NOTE — Assessment & Plan Note (Signed)
Start Losartan 

## 2018-07-31 ENCOUNTER — Ambulatory Visit (INDEPENDENT_AMBULATORY_CARE_PROVIDER_SITE_OTHER): Payer: Medicare Other | Admitting: Internal Medicine

## 2018-07-31 ENCOUNTER — Encounter: Payer: Self-pay | Admitting: Internal Medicine

## 2018-07-31 DIAGNOSIS — G4700 Insomnia, unspecified: Secondary | ICD-10-CM | POA: Diagnosis not present

## 2018-07-31 DIAGNOSIS — H9201 Otalgia, right ear: Secondary | ICD-10-CM | POA: Diagnosis not present

## 2018-07-31 DIAGNOSIS — E785 Hyperlipidemia, unspecified: Secondary | ICD-10-CM

## 2018-07-31 DIAGNOSIS — I10 Essential (primary) hypertension: Secondary | ICD-10-CM

## 2018-07-31 MED ORDER — ZOSTER VAC RECOMB ADJUVANTED 50 MCG/0.5ML IM SUSR: 0.5000 mL | Freq: Once | INTRAMUSCULAR | 1 refills | Status: AC

## 2018-07-31 MED ORDER — ZOLPIDEM TARTRATE 10 MG PO TABS: ORAL_TABLET | ORAL | 5 refills | Status: DC

## 2018-07-31 NOTE — Assessment & Plan Note (Signed)
x 2 d -- ?neuralgia Better: Advil prn

## 2018-07-31 NOTE — Progress Notes (Signed)
Subjective:  Patient ID: Olivia Odom, female    DOB: August 22, 1946  Age: 72 y.o. MRN: [id number]  CC: No chief complaint on file.   HPI Olivia Odom presents for HTN Started Losartan 2 mo ago SBP 102-141. No sx's C/o R ear ache F/u insomnia  Outpatient Medications Prior to Visit  Medication Sig Dispense Refill  . cholecalciferol (VITAMIN D) 1000 UNITS tablet Take 1,000 Units by mouth daily.      Marland Kitchen losartan (COZAAR) 50 MG tablet Take 1 tablet (50 mg total) by mouth daily. 90 tablet 3  . Omega-3 Fatty Acids (FISH OIL PO) Take 5 mLs by mouth.    . ondansetron (ZOFRAN) 4 MG tablet Take 1 tablet (4 mg total) by mouth every 8 (eight) hours as needed for nausea or vomiting. 20 tablet 0  . zolpidem (AMBIEN) 10 MG tablet TAKE ONE-HALF TO ONE TABLET BY MOUTH AT BEDTIME AS NEEDED FOR SLEEP 30 tablet 5  . ciprofloxacin (CIPRO) 500 MG tablet Take 1 tablet (500 mg total) by mouth 2 (two) times daily. 14 tablet 0   No facility-administered medications prior to visit.     ROS: Review of Systems  Constitutional: Negative for activity change, appetite change, chills, fatigue and unexpected weight change.  HENT: Negative for congestion, mouth sores and sinus pressure.   Eyes: Negative for visual disturbance.  Respiratory: Negative for cough and chest tightness.   Gastrointestinal: Negative for abdominal pain and nausea.  Genitourinary: Negative for difficulty urinating, frequency and vaginal pain.  Musculoskeletal: Negative for back pain and gait problem.  Skin: Negative for pallor and rash.  Neurological: Negative for dizziness, tremors, weakness, numbness and headaches.  Psychiatric/Behavioral: Positive for sleep disturbance. Negative for confusion and suicidal ideas.    Objective:  BP 134/84 (BP Location: Left Arm, Patient Position: Sitting, Cuff Size: Normal)   Pulse 81   Temp 98 F (36.7 C) (Oral)   Ht 5\' 2"  (1.575 m)   Wt 148 lb (67.1 kg)   SpO2 99%   BMI 27.07 kg/m   BP  Readings from Last 3 Encounters:  07/31/18 134/84  01/16/18 138/68  04/04/17 138/82    Wt Readings from Last 3 Encounters:  07/31/18 148 lb (67.1 kg)  01/16/18 149 lb 1.9 oz (67.6 kg)  04/04/17 144 lb 0.6 oz (65.3 kg)    Physical Exam  Constitutional: She appears well-developed. No distress.  HENT:  Head: Normocephalic.  Right Ear: External ear normal.  Left Ear: External ear normal.  Nose: Nose normal.  Mouth/Throat: Oropharynx is clear and moist.  Eyes: Pupils are equal, round, and reactive to light. Conjunctivae are normal. Right eye exhibits no discharge. Left eye exhibits no discharge.  Neck: Normal range of motion. Neck supple. No JVD present. No tracheal deviation present. No thyromegaly present.  Cardiovascular: Normal rate, regular rhythm and normal heart sounds.  Pulmonary/Chest: No stridor. No respiratory distress. She has no wheezes.  Abdominal: Soft. Bowel sounds are normal. She exhibits no distension and no mass. There is no tenderness. There is no rebound and no guarding.  Musculoskeletal: She exhibits no edema or tenderness.  Lymphadenopathy:    She has no cervical adenopathy.  Neurological: She displays normal reflexes. No cranial nerve deficit. She exhibits normal muscle tone. Coordination normal.  Skin: No rash noted. No erythema.  Psychiatric: She has a normal mood and affect. Her behavior is normal. Judgment and thought content normal.  B ears WNL TMJs NT No rash  Lab Results  Component  Value Date   WBC 5.1 03/27/2017   HGB 14.8 03/27/2017   HCT 43.5 03/27/2017   PLT 273.0 03/27/2017   GLUCOSE 107 (H) 03/27/2017   CHOL 263 (H) 03/27/2017   TRIG 88.0 03/27/2017   HDL 59.10 03/27/2017   LDLDIRECT 160.7 01/09/2012   LDLCALC 187 (H) 03/27/2017   ALT 17 03/27/2017   AST 18 03/27/2017   NA 141 03/27/2017   K 4.6 03/27/2017   CL 105 03/27/2017   CREATININE 0.94 03/27/2017   BUN 12 03/27/2017   CO2 29 03/27/2017   TSH 1.22 03/27/2017    Ct  Abdomen Pelvis W Wo Contrast  Result Date: 04/24/2017 CLINICAL DATA:  72 year old female with intermittent left upper quadrant tenderness for the past 5 months. Right renal mass noted on recent ultrasound examination. Followup study. EXAM: CT ABDOMEN AND PELVIS WITHOUT AND WITH CONTRAST TECHNIQUE: Multidetector CT imaging of the abdomen and pelvis was performed following the standard protocol before and following the bolus administration of intravenous contrast. CONTRAST:  100 mL of Isovue 370. COMPARISON:  No prior abdominal CT. Abdominal ultrasound 04/22/2017. FINDINGS: Lower chest: Areas of mild scarring are noted throughout the visualize lung bases. Hepatobiliary: In the inferior aspect of segment 6 (axial image 43 of series 7) there is an ill-defined hypoattenuating lesion measuring 1.8 cm which is indeterminate. This may demonstrate some peripheral nodular enhancement, but this is not definitive. No other larger cystic or solid hepatic lesions. No intra or extrahepatic biliary ductal dilatation. Gallbladder is normal in appearance. Pancreas: No pancreatic mass. No pancreatic ductal dilatation. No pancreatic or peripancreatic fluid or inflammatory changes. Spleen: Spleen is normal in appearance. Adrenals/Urinary Tract: There is an exophytic fatty attenuation lesion in the lateral aspect of the right kidney measuring approximately 1.6 cm in diameter (axial image 40 of series 7), compatible with an angiomyolipoma. No other suspicious renal lesions are noted. No hydroureteronephrosis. Urinary bladder is normal in appearance. Bilateral adrenal glands are normal in appearance. Stomach/Bowel: No pathologic dilatation of small bowel or colon. The appendix is not confidently identified and may be surgically absent. Regardless, there are no inflammatory changes noted adjacent to the cecum to suggest the presence of an acute appendicitis at this time. Vascular/Lymphatic: Aortic atherosclerosis, without evidence of  aneurysm or dissection in the abdominal or pelvic vasculature. No lymphadenopathy noted in the abdomen or pelvis. Reproductive: Uterus is retroflexed. Ovaries are unremarkable in appearance. Other: No significant volume of ascites.  No pneumoperitoneum. Musculoskeletal: There are no aggressive appearing lytic or blastic lesions noted in the visualized portions of the skeleton. IMPRESSION: 1. 1.6 cm fatty attenuation right renal lesion compatible with an angiomyolipoma. 2. No acute findings in the abdomen or pelvis to account for the patient's history of left upper quadrant pain. 3. 1.8 cm indeterminate lesion in the inferior aspect of segment 6 of the liver. Overall, this is favored to represent a benign lesion, potentially a focal area of fatty infiltration, or a small cavernous hemangioma, but this is indeterminate on today's examination. If further characterization is desired, or if there is a history of primary malignancy with concern for metastatic disease to the liver, further characterization with MRI of the abdomen with and without IV gadolinium could be considered. 4. Aortic atherosclerosis. Electronically Signed   By: Vinnie Langton M.D.   On: 04/24/2017 09:27    Assessment & Plan:   There are no diagnoses linked to this encounter.   No orders of the defined types were placed in this encounter.  Follow-up: No follow-ups on file.  Walker Kehr, MD

## 2018-07-31 NOTE — Assessment & Plan Note (Signed)
Good diet 

## 2018-07-31 NOTE — Assessment & Plan Note (Signed)
Losartan Labs 

## 2018-07-31 NOTE — Assessment & Plan Note (Signed)
Zolpidem prn  Potential benefits of a long term benzodiazepines  use as well as potential risks  and complications were explained to the patient and were aknowledged. 

## 2018-08-18 ENCOUNTER — Other Ambulatory Visit (INDEPENDENT_AMBULATORY_CARE_PROVIDER_SITE_OTHER): Payer: Medicare Other

## 2018-08-18 DIAGNOSIS — I10 Essential (primary) hypertension: Secondary | ICD-10-CM | POA: Diagnosis not present

## 2018-08-18 DIAGNOSIS — E785 Hyperlipidemia, unspecified: Secondary | ICD-10-CM | POA: Diagnosis not present

## 2018-08-18 DIAGNOSIS — Z23 Encounter for immunization: Secondary | ICD-10-CM | POA: Diagnosis not present

## 2018-08-18 LAB — LIPID PANEL
CHOL/HDL RATIO: 4
Cholesterol: 244 mg/dL — ABNORMAL HIGH (ref 0–200)
HDL: 65.6 mg/dL (ref >39.00)
LDL CALC: 161 mg/dL — AB (ref 0–99)
NONHDL: 178.3
Triglycerides: 87 mg/dL (ref 0.0–149.0)
VLDL: 17.4 mg/dL (ref 0.0–40.0)

## 2018-08-18 LAB — CBC WITH DIFFERENTIAL/PLATELET
Basophils Absolute: 0 10*3/uL (ref 0.0–0.1)
Basophils Relative: 0.7 % (ref 0.0–3.0)
EOS ABS: 0.1 10*3/uL (ref 0.0–0.7)
Eosinophils Relative: 1.3 % (ref 0.0–5.0)
HCT: 43.1 % (ref 36.0–46.0)
Hemoglobin: 14.8 g/dL (ref 12.0–15.0)
Lymphocytes Relative: 45.9 % (ref 12.0–46.0)
Lymphs Abs: 2.4 10*3/uL (ref 0.7–4.0)
MCHC: 34.2 g/dL (ref 30.0–36.0)
MCV: 93.2 fl (ref 78.0–100.0)
MONO ABS: 0.4 10*3/uL (ref 0.1–1.0)
Monocytes Relative: 7 % (ref 3.0–12.0)
Neutro Abs: 2.3 10*3/uL (ref 1.4–7.7)
Neutrophils Relative %: 45.1 % (ref 43.0–77.0)
Platelets: 255 10*3/uL (ref 150.0–400.0)
RBC: 4.63 Mil/uL (ref 3.87–5.11)
RDW: 12.7 % (ref 11.5–15.5)
WBC: 5.2 10*3/uL (ref 4.0–10.5)

## 2018-08-18 LAB — BASIC METABOLIC PANEL
BUN: 16 mg/dL (ref 6–23)
CHLORIDE: 104 meq/L (ref 96–112)
CO2: 29 mEq/L (ref 19–32)
Calcium: 9.7 mg/dL (ref 8.4–10.5)
Creatinine, Ser: 0.93 mg/dL (ref 0.40–1.20)
GFR: 63.33 mL/min (ref >60.00)
GLUCOSE: 104 mg/dL — AB (ref 70–99)
POTASSIUM: 4.4 meq/L (ref 3.5–5.1)
Sodium: 139 mEq/L (ref 135–145)

## 2018-08-18 LAB — URINALYSIS, ROUTINE W REFLEX MICROSCOPIC
Bilirubin Urine: NEGATIVE
HGB URINE DIPSTICK: NEGATIVE
Ketones, ur: NEGATIVE
Nitrite: NEGATIVE
RBC / HPF: NONE SEEN (ref 0–?)
Specific Gravity, Urine: 1.025 (ref 1.000–1.030)
TOTAL PROTEIN, URINE-UPE24: NEGATIVE
URINE GLUCOSE: NEGATIVE
Urobilinogen, UA: 0.2 (ref 0.0–1.0)
pH: 5.5 (ref 5.0–8.0)

## 2018-08-18 LAB — HEPATIC FUNCTION PANEL
ALT: 21 U/L (ref 0–35)
AST: 17 U/L (ref 0–37)
Albumin: 4.4 g/dL (ref 3.5–5.2)
Alkaline Phosphatase: 80 U/L (ref 39–117)
BILIRUBIN DIRECT: 0.1 mg/dL (ref 0.0–0.3)
BILIRUBIN TOTAL: 0.5 mg/dL (ref 0.2–1.2)
Total Protein: 7 g/dL (ref 6.0–8.3)

## 2018-08-18 LAB — TSH: TSH: 1.23 u[IU]/mL (ref 0.35–4.50)

## 2018-12-10 DIAGNOSIS — Z1231 Encounter for screening mammogram for malignant neoplasm of breast: Secondary | ICD-10-CM | POA: Diagnosis not present

## 2018-12-11 ENCOUNTER — Other Ambulatory Visit: Payer: Self-pay | Admitting: Obstetrics and Gynecology

## 2018-12-11 DIAGNOSIS — R928 Other abnormal and inconclusive findings on diagnostic imaging of breast: Secondary | ICD-10-CM

## 2018-12-12 DIAGNOSIS — R928 Other abnormal and inconclusive findings on diagnostic imaging of breast: Secondary | ICD-10-CM | POA: Insufficient documentation

## 2018-12-22 ENCOUNTER — Ambulatory Visit
Admission: RE | Admit: 2018-12-22 | Discharge: 2018-12-22 | Disposition: A | Discharge status: Home / Self Care | Payer: Medicare Other | Source: Ambulatory Visit | Attending: Obstetrics and Gynecology | Admitting: Obstetrics and Gynecology

## 2018-12-22 DIAGNOSIS — R928 Other abnormal and inconclusive findings on diagnostic imaging of breast: Secondary | ICD-10-CM

## 2018-12-22 DIAGNOSIS — R922 Inconclusive mammogram: Secondary | ICD-10-CM | POA: Diagnosis not present

## 2018-12-22 DIAGNOSIS — N6489 Other specified disorders of breast: Secondary | ICD-10-CM | POA: Diagnosis not present

## 2018-12-22 IMAGING — MG DIGITAL DIAGNOSTIC UNILATERAL RIGHT MAMMOGRAM WITH TOMO AND CAD
4 series · 4 of 12 positions shown · non-contrast
Comparison: Previous exam(s).

CLINICAL DATA: Screening recall for a possible asymmetry in the
right breast. Patient has a remote history a benign cyst excision
from the upper outer right breast.

EXAM:
DIGITAL DIAGNOSTIC RIGHT MAMMOGRAM WITH CAD AND TOMO
ULTRASOUND RIGHT BREAST

[R CC synth-2D]
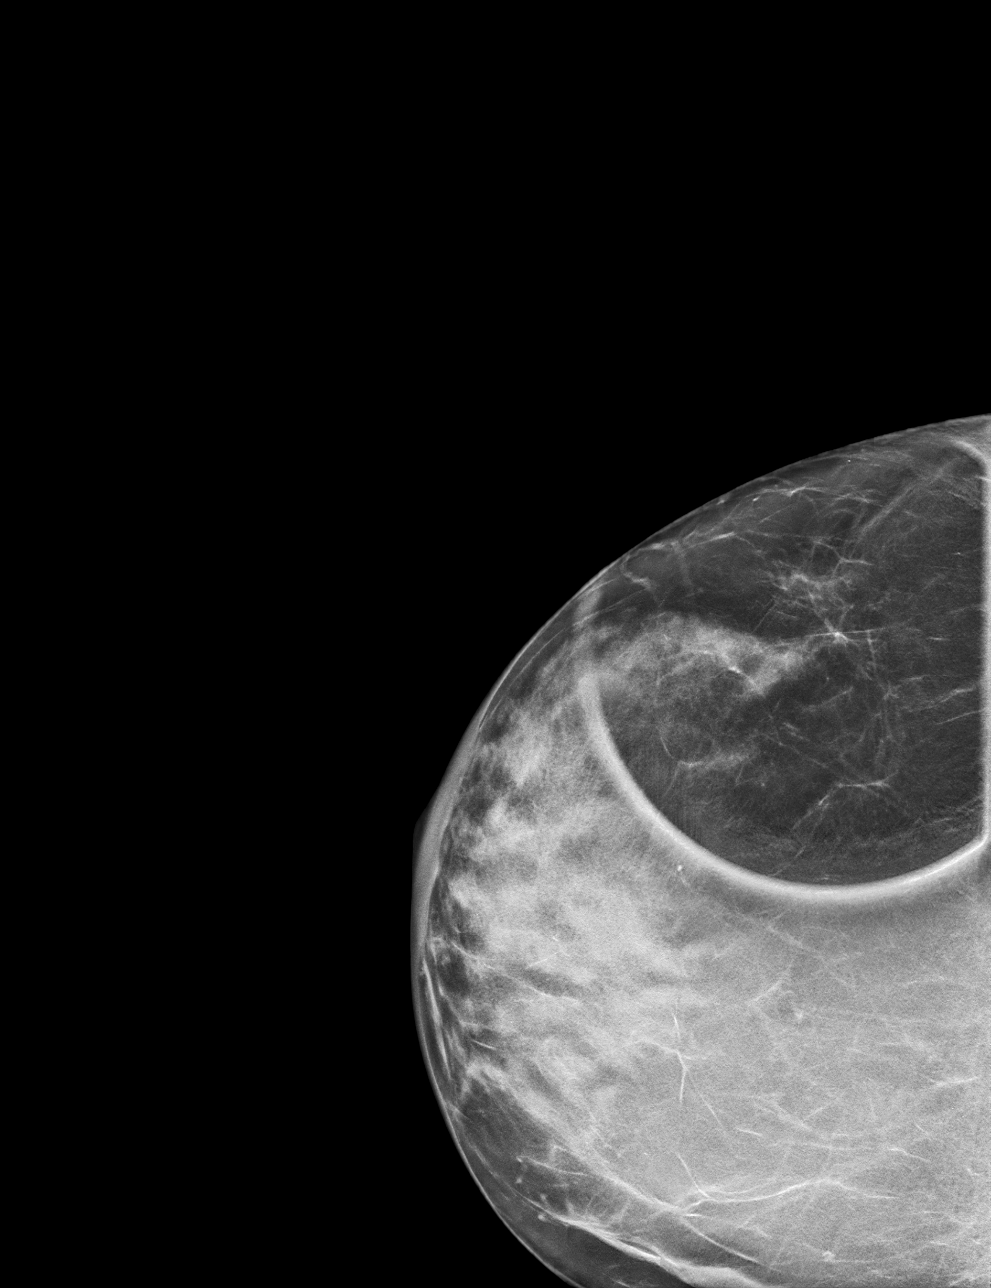

[R MLO synth-2D]
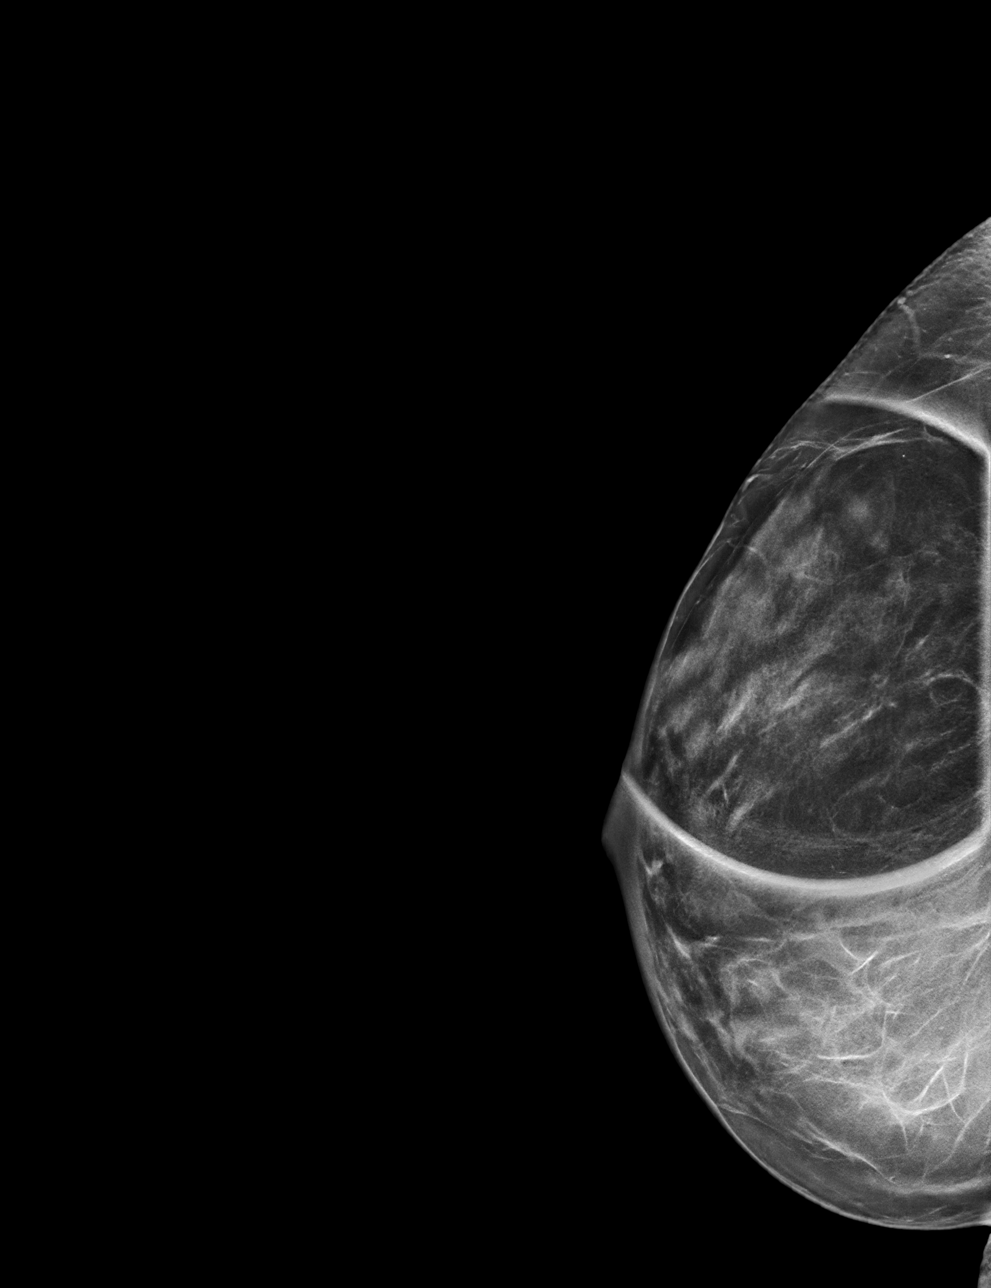

[R CC tomo · tomo slice 29/58.0]
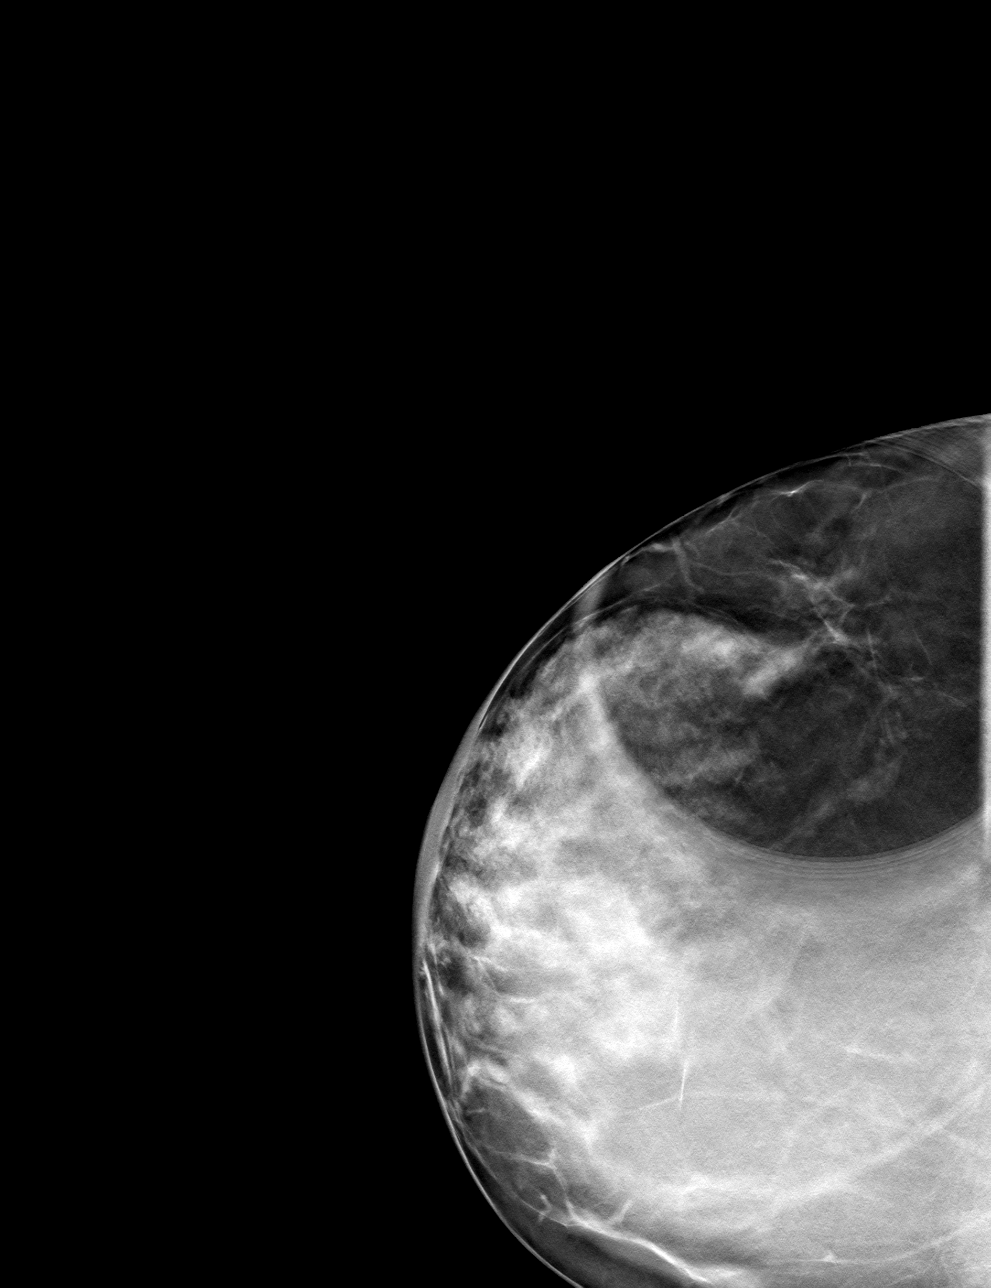

[R MLO tomo · tomo slice 31/62.0]
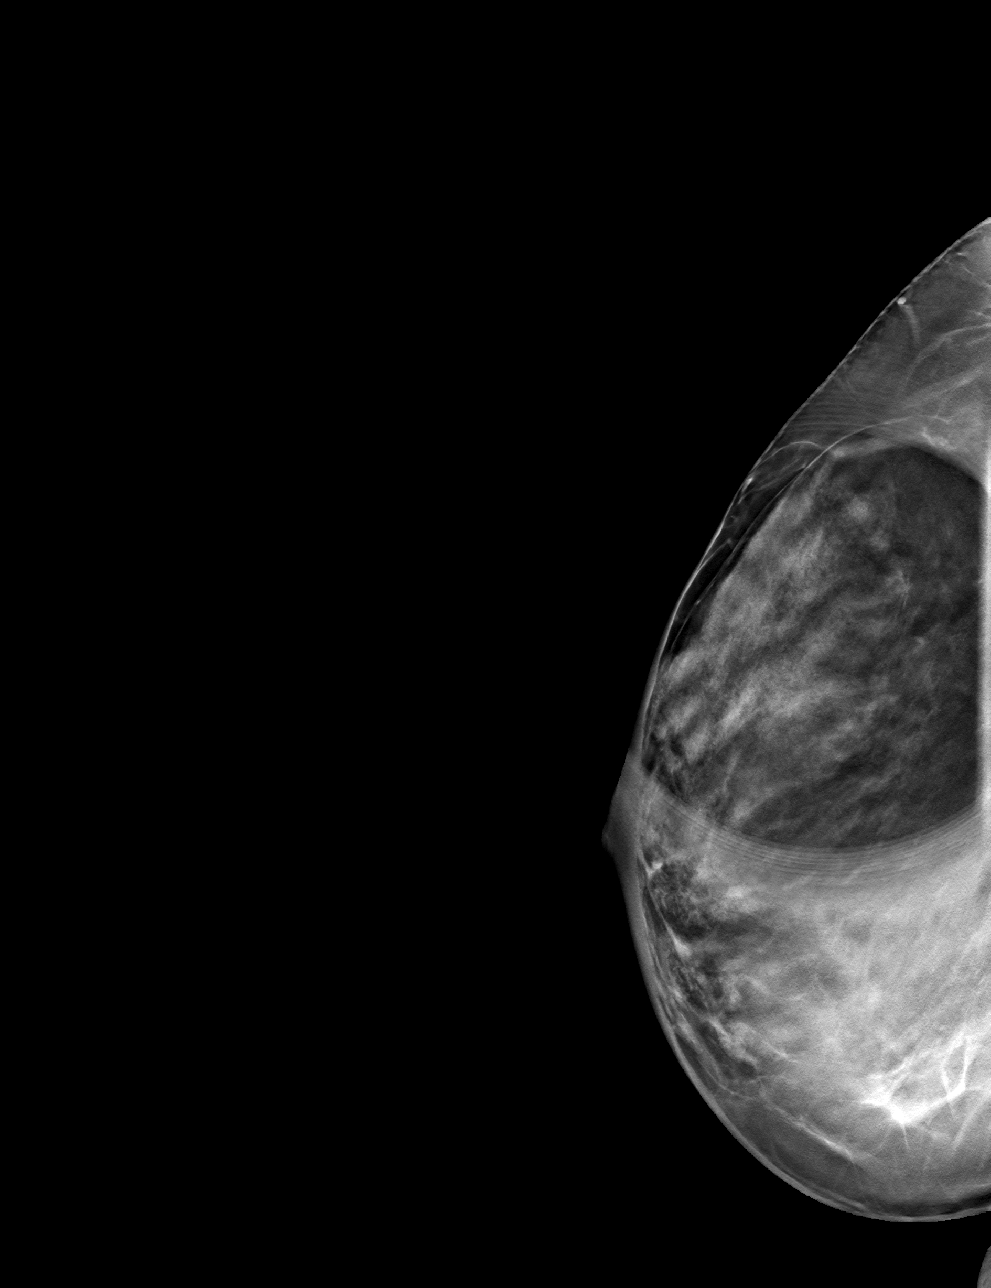

[4 of 12 positions shown; findings below may reference images not displayed]

ACR Breast Density Category c: The breast tissue is heterogeneously
dense, which may obscure small masses.
FINDINGS: Area of possible asymmetry disperses on spot compression imaging
consistent with normal fibroglandular tissue. There is mild stable
postsurgical architectural distortion in the upper outer quadrant.
There are no discrete masses and no areas of nonsurgical
architectural distortion. There are no new or suspicious
calcifications.

Mammographic images were processed with CAD.

On physical exam, no mass is palpated in the upper outer right
breast.

Targeted ultrasound is performed, showing normal fibroglandular
tissue throughout the upper outer right breast. No mass or
suspicious lesion.
IMPRESSION: Negative exam.  No evidence of breast malignancy.

RECOMMENDATION:
Screening mammogram in one year.(Code:[9D])

I have discussed the findings and recommendations with the patient.
Results were also provided in writing at the conclusion of the
visit. If applicable, a reminder letter will be sent to the patient
regarding the next appointment.

BI-RADS CATEGORY  1: Negative.

## 2018-12-25 ENCOUNTER — Other Ambulatory Visit: Payer: Self-pay | Admitting: Internal Medicine

## 2018-12-25 DIAGNOSIS — E785 Hyperlipidemia, unspecified: Secondary | ICD-10-CM

## 2018-12-30 DIAGNOSIS — Z01419 Encounter for gynecological examination (general) (routine) without abnormal findings: Secondary | ICD-10-CM | POA: Diagnosis not present

## 2018-12-30 DIAGNOSIS — R030 Elevated blood-pressure reading, without diagnosis of hypertension: Secondary | ICD-10-CM | POA: Insufficient documentation

## 2019-01-05 ENCOUNTER — Encounter: Payer: Self-pay | Admitting: Internal Medicine

## 2019-01-27 ENCOUNTER — Ambulatory Visit (INDEPENDENT_AMBULATORY_CARE_PROVIDER_SITE_OTHER)
Admission: RE | Admit: 2019-01-27 | Discharge: 2019-01-27 | Disposition: A | Discharge status: Home / Self Care | Payer: Self-pay | Source: Ambulatory Visit | Attending: Internal Medicine | Admitting: Internal Medicine

## 2019-01-27 DIAGNOSIS — E785 Hyperlipidemia, unspecified: Secondary | ICD-10-CM

## 2019-01-28 ENCOUNTER — Other Ambulatory Visit: Payer: Self-pay | Admitting: Internal Medicine

## 2019-01-28 MED ORDER — LOSARTAN POTASSIUM 50 MG PO TABS: 50.0000 mg | ORAL_TABLET | Freq: Every day | ORAL | 0 refills | Status: DC

## 2019-01-28 NOTE — Telephone Encounter (Signed)
Left message to call for 6 month follow up- courtesy refill given

## 2019-01-28 NOTE — Telephone Encounter (Signed)
Requested medication (s) are due for refill today -yes  Requested medication (s) are on the active medication list yes  Future visit scheduled no  Last refill: 07/31/18 5 RF  Notes to clinic: Patient is requesting refill of non delegated Rx- called patient and left message to schedule appointment.Sent for PCP review ( Change in pharmacy)  Requested Prescriptions  Pending Prescriptions Disp Refills   zolpidem (AMBIEN) 10 MG tablet 30 tablet 5    Sig: TAKE ONE-HALF TO ONE TABLET BY MOUTH AT BEDTIME AS NEEDED FOR SLEEP     Not Delegated - Psychiatry:  Anxiolytics/Hypnotics Failed - 01/28/2019  2:00 PM      Failed - This refill cannot be delegated      Failed - Urine Drug Screen completed in last 360 days.      Failed - Valid encounter within last 6 months    Recent Outpatient Visits          6 months ago Hypertension, unspecified type   [place] Plotnikov, Evie Lacks, MD   1 year ago Vitamin D deficiency   [place] Plotnikov, Evie Lacks, MD   1 year ago Vitamin D deficiency   [place], Evie Lacks, MD   2 years ago Well adult exam   [place] Plotnikov, Evie Lacks, MD   4 years ago Elevated BP   [facility], Evie Lacks, MD           Signed Prescriptions Disp Refills   losartan (COZAAR) 50 MG tablet 90 tablet 0    Sig: Take 1 tablet (50 mg total) by mouth daily.     Cardiovascular:  Angiotensin Receptor Blockers Failed - 01/28/2019  2:00 PM      Failed - Valid encounter within last 6 months    Recent Outpatient Visits          6 months ago Hypertension, unspecified type   [place], Evie Lacks, MD   1 year ago Vitamin D deficiency   [facility], Evie Lacks, MD   1 year ago Vitamin D deficiency   [place], Evie Lacks, MD   2 years ago Well adult exam   [place] Plotnikov, Evie Lacks, MD   4 years ago Elevated BP   [place] Plotnikov, Evie Lacks, MD             Passed - Cr in normal range and within 180 days    Creatinine, Ser  Date Value Ref Range Status  08/18/2018 0.93 0.40 - 1.20 mg/dL Final         Passed - K in normal range and within 180 days    Potassium  Date Value Ref Range Status  08/18/2018 4.4 3.5 - 5.1 mEq/L Final         Passed - Patient is not pregnant      Passed - Last BP in normal range    BP Readings from Last 1 Encounters:  07/31/18 134/84          Requested Prescriptions  Pending Prescriptions Disp Refills   zolpidem (AMBIEN) 10 MG tablet 30 tablet 5    Sig: TAKE ONE-HALF TO ONE TABLET BY MOUTH AT BEDTIME AS NEEDED FOR SLEEP     Not Delegated - Psychiatry:  Anxiolytics/Hypnotics Failed - 01/28/2019  2:00 PM      Failed - This refill cannot be delegated      Failed - Urine Drug Screen completed in last 360 days.      Failed - Valid encounter within last 6 months    Recent Outpatient Visits          6 months ago Hypertension, unspecified type   [place] Plot[facility], Evie Lacks, MD   1 year ago Vitamin D deficiency   [place] Plot[facility], Evie Lacks, MD   1 year ago Vitamin D deficiency   [place], Evie Lacks, MD   2 years ago Well adult exam   [place] Plotnikov, Evie Lacks, MD   4 years ago Elevated BP   [facility], Evie Lacks, MD           Signed Prescriptions Disp Refills   losartan (COZAAR) 50 MG tablet 90 tablet 0    Sig: Take 1 tablet (50 mg total) by mouth daily.     Cardiovascular:  Angiotensin Receptor Blockers Failed - 01/28/2019  2:00 PM      Failed - Valid encounter within last 6 months    Recent Outpatient Visits          6  months ago Hypertension, unspecified type   [place], Evie Lacks, MD   1 year ago Vitamin D deficiency   [facility], Evie Lacks, MD   1 year ago Vitamin D deficiency   [place], Evie Lacks, MD   2 years ago Well adult exam   [place] Plotnikov, Evie Lacks, MD   4 years ago Elevated BP   [place] Plotnikov, Evie Lacks, MD             Passed - Cr in normal range and within 180 days    Creatinine, Ser  Date Value Ref Range Status  08/18/2018 0.93 0.40 - 1.20 mg/dL Final         Passed - K in normal range and within 180 days    Potassium  Date Value Ref Range Status  08/18/2018 4.4 3.5 - 5.1 mEq/L Final         Passed - Patient is not pregnant      Passed - Last BP in normal range    BP Readings from Last 1 Encounters:  07/31/18 134/84

## 2019-01-28 NOTE — Telephone Encounter (Signed)
Copied from [place] [id number]. Topic: Quick Communication - Rx Refill/Question >> Jan 28, 2019  1:53 PM Sheppard Coil, [employer] L wrote: Medication:  losartan (COZAAR) 50 MG tablet zolpidem (AMBIEN) 10 MG tablet  Has the patient contacted their pharmacy? Yes - transferring pharmacies and they are requiring a new script (Agent: If no, request that the patient contact the pharmacy for the refill.) (Agent: If yes, when and what did the pharmacy advise?)  Preferred Pharmacy (with phone number or street name): [id number] - [place], [place] [phone] (Phone) [phone] (Fax)  Agent: Please be advised that RX refills may take up to 3 business days. We ask that you follow-up with your pharmacy.

## 2019-01-29 MED ORDER — ZOLPIDEM TARTRATE 10 MG PO TABS: ORAL_TABLET | ORAL | 5 refills | Status: DC

## 2019-02-16 ENCOUNTER — Ambulatory Visit (INDEPENDENT_AMBULATORY_CARE_PROVIDER_SITE_OTHER): Payer: Medicare Other | Admitting: Internal Medicine

## 2019-02-16 ENCOUNTER — Encounter: Payer: Self-pay | Admitting: Internal Medicine

## 2019-02-16 DIAGNOSIS — E785 Hyperlipidemia, unspecified: Secondary | ICD-10-CM | POA: Diagnosis not present

## 2019-02-16 DIAGNOSIS — I10 Essential (primary) hypertension: Secondary | ICD-10-CM | POA: Diagnosis not present

## 2019-02-16 MED ORDER — LOSARTAN POTASSIUM 50 MG PO TABS: 50.0000 mg | ORAL_TABLET | Freq: Every day | ORAL | 3 refills | Status: DC

## 2019-02-16 MED ORDER — LOSARTAN POTASSIUM 100 MG PO TABS: 100.0000 mg | ORAL_TABLET | Freq: Every day | ORAL | 3 refills | Status: DC

## 2019-02-16 NOTE — Progress Notes (Signed)
Subjective:  Patient ID: Olivia Odom, female    DOB: 1946-03-10  Age: 73 y.o. MRN: [id number]  CC: No chief complaint on file.   HPI Olivia Odom presents for HTN, insomnia, dyslipidemia f/u  Outpatient Medications Prior to Visit  Medication Sig Dispense Refill  . cholecalciferol (VITAMIN D) 1000 UNITS tablet Take 1,000 Units by mouth daily.      Marland Kitchen losartan (COZAAR) 50 MG tablet Take 1 tablet (50 mg total) by mouth daily. 90 tablet 0  . Omega-3 Fatty Acids (FISH OIL PO) Take 5 mLs by mouth.    . ondansetron (ZOFRAN) 4 MG tablet Take 1 tablet (4 mg total) by mouth every 8 (eight) hours as needed for nausea or vomiting. 20 tablet 0  . zolpidem (AMBIEN) 10 MG tablet TAKE ONE-HALF TO ONE TABLET BY MOUTH AT BEDTIME AS NEEDED FOR SLEEP 30 tablet 5   No facility-administered medications prior to visit.     ROS: Review of Systems  Constitutional: Negative for activity change, appetite change, chills, fatigue and unexpected weight change.  HENT: Negative for congestion, mouth sores and sinus pressure.   Eyes: Negative for visual disturbance.  Respiratory: Negative for cough and chest tightness.   Gastrointestinal: Negative for abdominal pain and nausea.  Genitourinary: Negative for difficulty urinating, frequency and vaginal pain.  Musculoskeletal: Negative for back pain and gait problem.  Skin: Negative for pallor and rash.  Neurological: Negative for dizziness, tremors, weakness, numbness and headaches.  Psychiatric/Behavioral: Positive for sleep disturbance. Negative for confusion and suicidal ideas. The patient is not nervous/anxious.     Objective:  BP (!) 142/84 (BP Location: Left Arm, Patient Position: Sitting, Cuff Size: Normal)   Pulse 71   Temp 98 F (36.7 C) (Oral)   Ht 5\' 2"  (1.575 m)   Wt 149 lb (67.6 kg)   SpO2 99%   BMI 27.25 kg/m   BP Readings from Last 3 Encounters:  02/16/19 (!) 142/84  07/31/18 134/84  01/16/18 138/68    Wt Readings from Last 3  Encounters:  02/16/19 149 lb (67.6 kg)  07/31/18 148 lb (67.1 kg)  01/16/18 149 lb 1.9 oz (67.6 kg)    Physical Exam Constitutional:      General: She is not in acute distress.    Appearance: She is well-developed.  HENT:     Head: Normocephalic.     Right Ear: External ear normal.     Left Ear: External ear normal.     Nose: Nose normal.  Eyes:     General:        Right eye: No discharge.        Left eye: No discharge.     Conjunctiva/sclera: Conjunctivae normal.     Pupils: Pupils are equal, round, and reactive to light.  Neck:     Musculoskeletal: Normal range of motion and neck supple.     Thyroid: No thyromegaly.     Vascular: No JVD.     Trachea: No tracheal deviation.  Cardiovascular:     Rate and Rhythm: Normal rate and regular rhythm.     Heart sounds: Normal heart sounds.  Pulmonary:     Effort: No respiratory distress.     Breath sounds: No stridor. No wheezing.  Abdominal:     General: Bowel sounds are normal. There is no distension.     Palpations: Abdomen is soft. There is no mass.     Tenderness: There is no abdominal tenderness. There is no guarding or  rebound.  Musculoskeletal:        General: No tenderness.  Lymphadenopathy:     Cervical: No cervical adenopathy.  Skin:    Findings: No erythema or rash.  Neurological:     Cranial Nerves: No cranial nerve deficit.     Motor: No abnormal muscle tone.     Coordination: Coordination normal.     Deep Tendon Reflexes: Reflexes normal.  Psychiatric:        Behavior: Behavior normal.        Thought Content: Thought content normal.        Judgment: Judgment normal.         Lab Results  Component Value Date   WBC 5.2 08/18/2018   HGB 14.8 08/18/2018   HCT 43.1 08/18/2018   PLT 255.0 08/18/2018   GLUCOSE 104 (H) 08/18/2018   CHOL 244 (H) 08/18/2018   TRIG 87.0 08/18/2018   HDL 65.60 08/18/2018   LDLDIRECT 160.7 01/09/2012   LDLCALC 161 (H) 08/18/2018   ALT 21 08/18/2018   AST 17 08/18/2018     NA 139 08/18/2018   K 4.4 08/18/2018   CL 104 08/18/2018   CREATININE 0.93 08/18/2018   BUN 16 08/18/2018   CO2 29 08/18/2018   TSH 1.23 08/18/2018    Ct Cardiac Scoring  Addendum Date: 01/27/2019   ADDENDUM REPORT: 01/27/2019 11:35 CLINICAL DATA:  Risk stratification EXAM: Coronary Calcium Score TECHNIQUE: The patient was scanned on a Siemens Somatom 64 slice scanner. Axial non-contrast 3 mm slices were carried out through the heart. The data set was analyzed on a dedicated work station and scored using the [place]. FINDINGS: Non-cardiac: See separate report from [facility]. Ascending aorta: Normal diameter 3.6 cm Pericardium: Normal Coronary arteries: No calcium noted IMPRESSION: Coronary calcium score of 0. Jenkins Rouge Electronically Signed   By: Jenkins Rouge M.D.   On: 01/27/2019 11:35   Result Date: 01/27/2019 EXAM: OVER-READ INTERPRETATION  CT CHEST The following report is an over-read performed by radiologist Dr. Rolm Baptise of [facility], [place] on 01/27/2019. This over-read does not include interpretation of cardiac or coronary anatomy or pathology. The coronary calcium score interpretation by the cardiologist is attached. COMPARISON:  None. FINDINGS: Vascular: Heart is normal size.  Aorta is normal caliber. Mediastinum/Nodes: No adenopathy in the lower mediastinum or hila. Lungs/Pleura: Linear scarring in the lingula and right middle lobe anteriorly. No confluent opacities or effusions. Upper Abdomen: Imaging into the upper abdomen shows no acute findings. Musculoskeletal: Chest wall soft tissues are unremarkable. No acute bony abnormality. IMPRESSION: No acute or significant extracardiac abnormality. Electronically Signed: By: Rolm Baptise M.D. On: 01/27/2019 09:23    Assessment & Plan:   There are no diagnoses linked to this encounter.   No orders of the defined types were placed in this encounter.    Follow-up: No follow-ups on file.  Walker Kehr,  MD

## 2019-02-16 NOTE — Patient Instructions (Signed)
Weighted blanket 

## 2019-02-16 NOTE — Assessment & Plan Note (Signed)
Losartan - increase to 100 mg/d  

## 2019-02-17 ENCOUNTER — Encounter: Payer: Self-pay | Admitting: Internal Medicine

## 2019-02-17 NOTE — Assessment & Plan Note (Signed)
Coronary calcium score of 0 - 01/2019

## 2019-02-17 NOTE — Assessment & Plan Note (Signed)
On no meds for it

## 2019-07-27 ENCOUNTER — Other Ambulatory Visit: Payer: Self-pay | Admitting: Internal Medicine

## 2019-08-27 DIAGNOSIS — Z23 Encounter for immunization: Secondary | ICD-10-CM | POA: Diagnosis not present

## 2019-11-11 DIAGNOSIS — Z20828 Contact with and (suspected) exposure to other viral communicable diseases: Secondary | ICD-10-CM | POA: Diagnosis not present

## 2019-12-01 ENCOUNTER — Encounter: Payer: Self-pay | Admitting: Internal Medicine

## 2019-12-01 ENCOUNTER — Other Ambulatory Visit: Payer: Self-pay

## 2019-12-01 ENCOUNTER — Ambulatory Visit (INDEPENDENT_AMBULATORY_CARE_PROVIDER_SITE_OTHER): Payer: Medicare Other | Admitting: Internal Medicine

## 2019-12-01 VITALS — BP 142/82 | HR 70 | Temp 97.9°F | Ht 62.0 in | Wt 146.0 lb

## 2019-12-01 DIAGNOSIS — I10 Essential (primary) hypertension: Secondary | ICD-10-CM | POA: Diagnosis not present

## 2019-12-01 DIAGNOSIS — E559 Vitamin D deficiency, unspecified: Secondary | ICD-10-CM

## 2019-12-01 DIAGNOSIS — E785 Hyperlipidemia, unspecified: Secondary | ICD-10-CM

## 2019-12-01 DIAGNOSIS — M654 Radial styloid tenosynovitis [de Quervain]: Secondary | ICD-10-CM | POA: Insufficient documentation

## 2019-12-01 MED ORDER — VITAMIN D3 50 MCG (2000 UT) PO CAPS: 2000.0000 [IU] | ORAL_CAPSULE | Freq: Every day | ORAL | 3 refills | Status: AC

## 2019-12-01 MED ORDER — LORATADINE 10 MG PO TABS: 10.0000 mg | ORAL_TABLET | Freq: Every day | ORAL | 3 refills | Status: DC

## 2019-12-01 MED ORDER — ZOLPIDEM TARTRATE 10 MG PO TABS: ORAL_TABLET | ORAL | 1 refills | Status: DC

## 2019-12-01 NOTE — Progress Notes (Signed)
Subjective:  Patient ID: Jonelle Sports, female    DOB: [birth year]  Age: 73 y.o. MRN: [id number]  CC: No chief complaint on file.   HPI JULANE MORRICAL presents for insomnia, HTN C/o L  wist and thumb pain x months BP is ok at home  Outpatient Medications Prior to Visit  Medication Sig Dispense Refill  . cholecalciferol (VITAMIN D) 1000 UNITS tablet Take 1,000 Units by mouth daily.      Marland Kitchen losartan (COZAAR) 100 MG tablet Take 1 tablet (100 mg total) by mouth daily. 90 tablet 3  . Omega-3 Fatty Acids (FISH OIL PO) Take 5 mLs by mouth.    . ondansetron (ZOFRAN) 4 MG tablet Take 1 tablet (4 mg total) by mouth every 8 (eight) hours as needed for nausea or vomiting. 20 tablet 0  . zolpidem (AMBIEN) 10 MG tablet TAKE ONE-HALF TO ONE TABLET BY MOUTH AT BEDTIME AS NEEDED FOR SLEEP 30 tablet 5   No facility-administered medications prior to visit.     ROS: Review of Systems  Constitutional: Negative for activity change, appetite change, chills, fatigue and unexpected weight change.  HENT: Positive for congestion. Negative for mouth sores and sinus pressure.   Eyes: Negative for visual disturbance.  Respiratory: Negative for cough and chest tightness.   Gastrointestinal: Negative for abdominal pain and nausea.  Genitourinary: Negative for difficulty urinating, frequency and vaginal pain.  Musculoskeletal: Negative for back pain and gait problem.  Skin: Negative for pallor and rash.  Neurological: Negative for dizziness, tremors, weakness, numbness and headaches.  Psychiatric/Behavioral: Negative for confusion and sleep disturbance.    Objective:  BP (!) 142/82 (BP Location: Left Arm, Patient Position: Sitting, Cuff Size: Normal)   Pulse 70   Temp 97.9 F (36.6 C) (Oral)   Ht 5\' 2"  (1.575 m)   Wt 146 lb (66.2 kg)   SpO2 99%   BMI 26.70 kg/m   BP Readings from Last 3 Encounters:  12/01/19 (!) 142/82  02/16/19 (!) 142/84  07/31/18 134/84    Wt Readings from Last 3  Encounters:  12/01/19 146 lb (66.2 kg)  02/16/19 149 lb (67.6 kg)  07/31/18 148 lb (67.1 kg)    Physical Exam Constitutional:      General: She is not in acute distress.    Appearance: She is well-developed.  HENT:     Head: Normocephalic.     Right Ear: External ear normal.     Left Ear: External ear normal.     Nose: Nose normal.  Eyes:     General:        Right eye: No discharge.        Left eye: No discharge.     Conjunctiva/sclera: Conjunctivae normal.     Pupils: Pupils are equal, round, and reactive to light.  Neck:     Musculoskeletal: Normal range of motion and neck supple.     Thyroid: No thyromegaly.     Vascular: No JVD.     Trachea: No tracheal deviation.  Cardiovascular:     Rate and Rhythm: Normal rate and regular rhythm.     Heart sounds: Normal heart sounds.  Pulmonary:     Effort: No respiratory distress.     Breath sounds: No stridor. No wheezing.  Abdominal:     General: Bowel sounds are normal. There is no distension.     Palpations: Abdomen is soft. There is no mass.     Tenderness: There is no abdominal tenderness. There is  no guarding or rebound.  Musculoskeletal:        General: No tenderness.  Lymphadenopathy:     Cervical: No cervical adenopathy.  Skin:    Findings: No erythema or rash.  Neurological:     Cranial Nerves: No cranial nerve deficit.     Motor: No abnormal muscle tone.     Coordination: Coordination normal.     Deep Tendon Reflexes: Reflexes normal.  Psychiatric:        Behavior: Behavior normal.        Thought Content: Thought content normal.        Judgment: Judgment normal.   L wrist abd pol longus - painful  Lab Results  Component Value Date   WBC 5.2 08/18/2018   HGB 14.8 08/18/2018   HCT 43.1 08/18/2018   PLT 255.0 08/18/2018   GLUCOSE 104 (H) 08/18/2018   CHOL 244 (H) 08/18/2018   TRIG 87.0 08/18/2018   HDL 65.60 08/18/2018   LDLDIRECT 160.7 01/09/2012   LDLCALC 161 (H) 08/18/2018   ALT 21 08/18/2018    AST 17 08/18/2018   NA 139 08/18/2018   K 4.4 08/18/2018   CL 104 08/18/2018   CREATININE 0.93 08/18/2018   BUN 16 08/18/2018   CO2 29 08/18/2018   TSH 1.23 08/18/2018    Ct Cardiac Scoring  Addendum Date: 01/27/2019   ADDENDUM REPORT: 01/27/2019 11:35 CLINICAL DATA:  Risk stratification EXAM: Coronary Calcium Score TECHNIQUE: The patient was scanned on a Siemens Somatom 64 slice scanner. Axial non-contrast 3 mm slices were carried out through the heart. The data set was analyzed on a dedicated work station and scored using the [place]. FINDINGS: Non-cardiac: See separate report from [facility]. Ascending aorta: Normal diameter 3.6 cm Pericardium: Normal Coronary arteries: No calcium noted IMPRESSION: Coronary calcium score of 0. Jenkins Rouge Electronically Signed   By: Jenkins Rouge M.D.   On: 01/27/2019 11:35   Result Date: 01/27/2019 EXAM: OVER-READ INTERPRETATION  CT CHEST The following report is an over-read performed by radiologist Dr. Rolm Baptise of [facility], [place] on 01/27/2019. This over-read does not include interpretation of cardiac or coronary anatomy or pathology. The coronary calcium score interpretation by the cardiologist is attached. COMPARISON:  None. FINDINGS: Vascular: Heart is normal size.  Aorta is normal caliber. Mediastinum/Nodes: No adenopathy in the lower mediastinum or hila. Lungs/Pleura: Linear scarring in the lingula and right middle lobe anteriorly. No confluent opacities or effusions. Upper Abdomen: Imaging into the upper abdomen shows no acute findings. Musculoskeletal: Chest wall soft tissues are unremarkable. No acute bony abnormality. IMPRESSION: No acute or significant extracardiac abnormality. Electronically Signed: By: Rolm Baptise M.D. On: 01/27/2019 09:23    Assessment & Plan:   There are no diagnoses linked to this encounter.   No orders of the defined types were placed in this encounter.    Follow-up: No follow-ups on file.   Walker Kehr, MD

## 2019-12-01 NOTE — Assessment & Plan Note (Signed)
Losartan 

## 2019-12-01 NOTE — Assessment & Plan Note (Signed)
Vit D 

## 2019-12-01 NOTE — Assessment & Plan Note (Signed)
Ice, splint Voltaren gel

## 2019-12-01 NOTE — Patient Instructions (Signed)
De Quervain's Tenosynovitis  De Quervain's tenosynovitis is a condition that causes inflammation of the tendon on the thumb side of the wrist. Tendons are cords of tissue that connect bones to muscles. The tendons in the hand pass through a tunnel called a sheath. A slippery layer of tissue (synovium) lets the tendons move smoothly in the sheath. With de Quervain's tenosynovitis, the sheath swells or thickens, causing friction and pain. The condition is also called de Quervain's disease and de Quervain's syndrome. It occurs most often in women who are 30-50 years old. What are the causes? The exact cause of this condition is not known. It may be associated with overuse of the hand and wrist. What increases the risk? You are more likely to develop this condition if you:  Use your hands far more than normal, especially if you repeat certain movements that involve twisting your hand or using a tight grip.  Are pregnant.  Are a middle-aged woman.  Have rheumatoid arthritis.  Have diabetes. What are the signs or symptoms? The main symptom of this condition is pain on the thumb side of the wrist. The pain may get worse when you grasp something or turn your wrist. Other symptoms may include:  Pain that extends up the forearm.  Swelling of your wrist and hand.  Trouble moving the thumb and wrist.  A sensation of snapping in the wrist.  A bump filled with fluid (cyst) in the area of the pain. How is this diagnosed? This condition may be diagnosed based on:  Your symptoms and medical history.  A physical exam. During the exam, your health care provider may do a simple test (Finkelstein test) that involves pulling your thumb and wrist to see if this causes pain. You may also need to have an X-ray. How is this treated? Treatment for this condition may include:  Avoiding any activity that causes pain and swelling.  Taking medicines. Anti-inflammatory medicines and corticosteroid  injections may be used to reduce inflammation and relieve pain.  Wearing a splint.  Having surgery. This may be needed if other treatments do not work. Once the pain and swelling has gone down:  Physical therapy. This includes stretching and strengthening exercises.  Occupational therapy. This includes adjusting how you move your wrist. Follow these instructions at home: If you have a splint:  Wear the splint as told by your health care provider. Remove it only as told by your health care provider.  Loosen the splint if your fingers tingle, become numb, or turn cold and blue.  Keep the splint clean.  If the splint is not waterproof: ? Do not let it get wet. ? Cover it with a watertight covering when you take a bath or a shower. Managing pain, stiffness, and swelling   Avoid movements and activities that cause pain and swelling in the wrist area.  If directed, put ice on the painful area. This may be helpful after doing activities that involve the sore wrist. ? Put ice in a plastic bag. ? Place a towel between your skin and the bag. ? Leave the ice on for 20 minutes, 2-3 times a day.  Move your fingers often to avoid stiffness and to lessen swelling.  Raise (elevate) the injured area above the level of your heart while you are sitting or lying down. General instructions  Return to your normal activities as told by your health care provider. Ask your health care provider what activities are safe for you.  Take over-the-counter   and prescription medicines only as told by your health care provider.  Keep all follow-up visits as told by your health care provider. This is important. Contact a health care provider if:  Your pain medicine does not help.  Your pain gets worse.  You develop new symptoms. Summary  De Quervain's tenosynovitis is a condition that causes inflammation of the tendon on the thumb side of the wrist.  The condition occurs most often in women who are  83-65 years old.  The exact cause of this condition is not known. It may be associated with overuse of the hand and wrist.  Treatment starts with avoiding activity that causes pain or swelling in the wrist area. Other treatment may include wearing a splint and taking medicine. Sometimes, surgery is needed. This information is not intended to replace advice given to you by your health care provider. Make sure you discuss any questions you have with your health care provider. Document Released: 09/04/2001 Document Revised: 06/12/2018 Document Reviewed: 11/18/2017 Elsevier Patient Education  2020 [employer].

## 2019-12-03 ENCOUNTER — Other Ambulatory Visit (INDEPENDENT_AMBULATORY_CARE_PROVIDER_SITE_OTHER): Payer: Medicare Other

## 2019-12-03 DIAGNOSIS — E785 Hyperlipidemia, unspecified: Secondary | ICD-10-CM | POA: Diagnosis not present

## 2019-12-03 DIAGNOSIS — E559 Vitamin D deficiency, unspecified: Secondary | ICD-10-CM

## 2019-12-03 DIAGNOSIS — I10 Essential (primary) hypertension: Secondary | ICD-10-CM | POA: Diagnosis not present

## 2019-12-03 LAB — TSH: TSH: 1.21 u[IU]/mL (ref 0.35–4.50)

## 2019-12-03 LAB — BASIC METABOLIC PANEL
BUN: 14 mg/dL (ref 6–23)
CO2: 26 mEq/L (ref 19–32)
Calcium: 9.4 mg/dL (ref 8.4–10.5)
Chloride: 105 mEq/L (ref 96–112)
Creatinine, Ser: 0.97 mg/dL (ref 0.40–1.20)
GFR: 56.55 mL/min — ABNORMAL LOW (ref >60.00)
Glucose, Bld: 102 mg/dL — ABNORMAL HIGH (ref 70–99)
Potassium: 4.5 mEq/L (ref 3.5–5.1)
Sodium: 140 mEq/L (ref 135–145)

## 2019-12-03 LAB — URINALYSIS, ROUTINE W REFLEX MICROSCOPIC
Bilirubin Urine: NEGATIVE
Hgb urine dipstick: NEGATIVE
Ketones, ur: NEGATIVE
Nitrite: NEGATIVE
RBC / HPF: NONE SEEN (ref 0–?)
Specific Gravity, Urine: 1.025 (ref 1.000–1.030)
Total Protein, Urine: NEGATIVE
Urine Glucose: NEGATIVE
Urobilinogen, UA: 0.2 (ref 0.0–1.0)
pH: 5.5 (ref 5.0–8.0)

## 2019-12-03 LAB — CBC WITH DIFFERENTIAL/PLATELET
Basophils Absolute: 0 10*3/uL (ref 0.0–0.1)
Basophils Relative: 0.8 % (ref 0.0–3.0)
Eosinophils Absolute: 0.1 10*3/uL (ref 0.0–0.7)
Eosinophils Relative: 1.2 % (ref 0.0–5.0)
HCT: 43.4 % (ref 36.0–46.0)
Hemoglobin: 14.5 g/dL (ref 12.0–15.0)
Lymphocytes Relative: 44.6 % (ref 12.0–46.0)
Lymphs Abs: 2.3 10*3/uL (ref 0.7–4.0)
MCHC: 33.3 g/dL (ref 30.0–36.0)
MCV: 94.8 fl (ref 78.0–100.0)
Monocytes Absolute: 0.4 10*3/uL (ref 0.1–1.0)
Monocytes Relative: 7.4 % (ref 3.0–12.0)
Neutro Abs: 2.4 10*3/uL (ref 1.4–7.7)
Neutrophils Relative %: 46 % (ref 43.0–77.0)
Platelets: 254 10*3/uL (ref 150.0–400.0)
RBC: 4.57 Mil/uL (ref 3.87–5.11)
RDW: 12.7 % (ref 11.5–15.5)
WBC: 5.2 10*3/uL (ref 4.0–10.5)

## 2019-12-03 LAB — HEPATIC FUNCTION PANEL
ALT: 21 U/L (ref 0–35)
AST: 20 U/L (ref 0–37)
Albumin: 4.4 g/dL (ref 3.5–5.2)
Alkaline Phosphatase: 81 U/L (ref 39–117)
Bilirubin, Direct: 0.1 mg/dL (ref 0.0–0.3)
Total Bilirubin: 0.5 mg/dL (ref 0.2–1.2)
Total Protein: 7.4 g/dL (ref 6.0–8.3)

## 2019-12-03 LAB — LIPID PANEL
Cholesterol: 281 mg/dL — ABNORMAL HIGH (ref 0–200)
HDL: 63.8 mg/dL (ref >39.00)
LDL Cholesterol: 197 mg/dL — ABNORMAL HIGH (ref 0–99)
NonHDL: 216.73
Total CHOL/HDL Ratio: 4
Triglycerides: 99 mg/dL (ref 0.0–149.0)
VLDL: 19.8 mg/dL (ref 0.0–40.0)

## 2019-12-04 ENCOUNTER — Other Ambulatory Visit: Payer: Self-pay | Admitting: Internal Medicine

## 2019-12-04 ENCOUNTER — Encounter: Payer: Self-pay | Admitting: Internal Medicine

## 2019-12-04 DIAGNOSIS — N289 Disorder of kidney and ureter, unspecified: Secondary | ICD-10-CM | POA: Insufficient documentation

## 2019-12-04 DIAGNOSIS — N2889 Other specified disorders of kidney and ureter: Secondary | ICD-10-CM | POA: Insufficient documentation

## 2019-12-04 DIAGNOSIS — N1831 Chronic kidney disease, stage 3a: Secondary | ICD-10-CM | POA: Insufficient documentation

## 2019-12-04 NOTE — Assessment & Plan Note (Signed)
Abd MRI

## 2019-12-30 DIAGNOSIS — Z1231 Encounter for screening mammogram for malignant neoplasm of breast: Secondary | ICD-10-CM | POA: Diagnosis not present

## 2020-01-01 ENCOUNTER — Other Ambulatory Visit: Payer: Medicare Other

## 2020-01-01 ENCOUNTER — Ambulatory Visit
Admission: RE | Admit: 2020-01-01 | Discharge: 2020-01-01 | Disposition: A | Discharge status: Home / Self Care | Payer: Medicare Other | Source: Ambulatory Visit | Attending: Internal Medicine | Admitting: Internal Medicine

## 2020-01-01 ENCOUNTER — Other Ambulatory Visit: Payer: Self-pay

## 2020-01-01 DIAGNOSIS — N2889 Other specified disorders of kidney and ureter: Secondary | ICD-10-CM | POA: Diagnosis not present

## 2020-01-01 DIAGNOSIS — N1831 Chronic kidney disease, stage 3a: Secondary | ICD-10-CM

## 2020-01-01 MED ORDER — GADOBENATE DIMEGLUMINE 529 MG/ML IV SOLN
14.0000 mL | Freq: Once | INTRAVENOUS | Status: AC | PRN
  Administered 2020-01-01: 14 mL via INTRAVENOUS

## 2020-04-19 ENCOUNTER — Other Ambulatory Visit: Payer: Self-pay | Admitting: Internal Medicine

## 2020-04-20 ENCOUNTER — Telehealth: Payer: Self-pay

## 2020-04-20 NOTE — Telephone Encounter (Signed)
KeyEL:[id number]  PA started and clinical information has been entered. Waiting on determination.

## 2020-05-05 ENCOUNTER — Encounter: Payer: Self-pay | Admitting: Internal Medicine

## 2020-05-05 DIAGNOSIS — D125 Benign neoplasm of sigmoid colon: Secondary | ICD-10-CM | POA: Diagnosis not present

## 2020-05-05 DIAGNOSIS — Z86010 Personal history of colonic polyps: Secondary | ICD-10-CM | POA: Diagnosis not present

## 2020-05-05 DIAGNOSIS — Z800 Family history of malignant neoplasm of digestive organs: Secondary | ICD-10-CM | POA: Diagnosis not present

## 2020-05-05 DIAGNOSIS — Z8371 Family history of colonic polyps: Secondary | ICD-10-CM | POA: Diagnosis not present

## 2020-05-05 DIAGNOSIS — K635 Polyp of colon: Secondary | ICD-10-CM | POA: Diagnosis not present

## 2020-05-05 DIAGNOSIS — Z1211 Encounter for screening for malignant neoplasm of colon: Secondary | ICD-10-CM | POA: Diagnosis not present

## 2020-06-26 DIAGNOSIS — L509 Urticaria, unspecified: Secondary | ICD-10-CM | POA: Diagnosis not present

## 2020-06-26 DIAGNOSIS — Z91038 Other insect allergy status: Secondary | ICD-10-CM | POA: Diagnosis not present

## 2020-07-20 ENCOUNTER — Other Ambulatory Visit: Payer: Self-pay | Admitting: Internal Medicine

## 2020-07-22 ENCOUNTER — Other Ambulatory Visit: Payer: Self-pay | Admitting: Internal Medicine

## 2020-07-22 MED ORDER — ZOLPIDEM TARTRATE 10 MG PO TABS: ORAL_TABLET | ORAL | 5 refills | Status: DC

## 2020-07-22 NOTE — Telephone Encounter (Signed)
[place] Controlled Database Checked Last filled: 04/19/2020 (90) LOV w/you: 12/01/2019 Next appt w/you: none

## 2020-08-17 DIAGNOSIS — Z23 Encounter for immunization: Secondary | ICD-10-CM | POA: Diagnosis not present

## 2020-08-23 DIAGNOSIS — Z20822 Contact with and (suspected) exposure to covid-19: Secondary | ICD-10-CM | POA: Diagnosis not present

## 2020-09-15 DIAGNOSIS — Z23 Encounter for immunization: Secondary | ICD-10-CM | POA: Diagnosis not present

## 2020-12-12 ENCOUNTER — Other Ambulatory Visit: Payer: Self-pay

## 2020-12-12 ENCOUNTER — Ambulatory Visit (INDEPENDENT_AMBULATORY_CARE_PROVIDER_SITE_OTHER): Payer: Medicare Other | Admitting: Internal Medicine

## 2020-12-12 ENCOUNTER — Encounter: Payer: Self-pay | Admitting: Internal Medicine

## 2020-12-12 DIAGNOSIS — Z91038 Other insect allergy status: Secondary | ICD-10-CM | POA: Insufficient documentation

## 2020-12-12 DIAGNOSIS — N289 Disorder of kidney and ureter, unspecified: Secondary | ICD-10-CM | POA: Diagnosis not present

## 2020-12-12 DIAGNOSIS — E785 Hyperlipidemia, unspecified: Secondary | ICD-10-CM | POA: Diagnosis not present

## 2020-12-12 DIAGNOSIS — E559 Vitamin D deficiency, unspecified: Secondary | ICD-10-CM | POA: Diagnosis not present

## 2020-12-12 DIAGNOSIS — I10 Essential (primary) hypertension: Secondary | ICD-10-CM | POA: Diagnosis not present

## 2020-12-12 DIAGNOSIS — N1831 Chronic kidney disease, stage 3a: Secondary | ICD-10-CM

## 2020-12-12 MED ORDER — ZOLPIDEM TARTRATE 10 MG PO TABS: ORAL_TABLET | ORAL | 5 refills | Status: DC

## 2020-12-12 MED ORDER — LOSARTAN POTASSIUM 50 MG PO TABS: 50.0000 mg | ORAL_TABLET | Freq: Every day | ORAL | 3 refills | Status: DC

## 2020-12-12 NOTE — Assessment & Plan Note (Signed)
7/21 - ?insect Epipen, Benadryl, Sudafed, Prednisone prn

## 2020-12-12 NOTE — Assessment & Plan Note (Addendum)
Cont w/Losartan - 100 mg/d.  Monitor blood pressure at home

## 2020-12-12 NOTE — Assessment & Plan Note (Signed)
MRI IMPRESSION: 1. Minimal interval increase in size of the right upper pole renal angiomyolipoma comparing to CT scan from 04/24/2017. 2. Similar appearance of a 19 mm cavernous hemangioma identified inferior right liver. Electronically Signed   By: Misty Stanley M.D.   On: 01/02/2020 11:55

## 2020-12-12 NOTE — Assessment & Plan Note (Signed)
Cont w/Vit D 2000 iu a day

## 2020-12-12 NOTE — Assessment & Plan Note (Signed)
On diet Mild  Coronary calcium score of 0 - 01/2019

## 2020-12-12 NOTE — Progress Notes (Signed)
Subjective:  Patient ID: Olivia Odom, female    DOB: 29-Apr-1946  Age: 74 y.o. MRN: [id number]  CC: Annual Exam   HPI Olivia Odom presents for HTN, CRI, renal cyst. Insomnia f/u  Outpatient Medications Prior to Visit  Medication Sig Dispense Refill  . Cholecalciferol (VITAMIN D3) 50 MCG (2000 UT) capsule Take 1 capsule (2,000 Units total) by mouth daily. 100 capsule 3  . losartan (COZAAR) 50 MG tablet TAKE 1 TABLET BY MOUTH EVERY DAY 90 tablet 3  . ondansetron (ZOFRAN) 4 MG tablet Take 1 tablet (4 mg total) by mouth every 8 (eight) hours as needed for nausea or vomiting. 20 tablet 0  . zolpidem (AMBIEN) 10 MG tablet 1 po qhs prn 30 tablet 5  . loratadine (CLARITIN) 10 MG tablet Take 1 tablet (10 mg total) by mouth daily. (Patient not taking: Reported on 12/12/2020) 90 tablet 3  . losartan (COZAAR) 100 MG tablet Take 1 tablet (100 mg total) by mouth daily. 90 tablet 3  . Omega-3 Fatty Acids (FISH OIL PO) Take 5 mLs by mouth.     No facility-administered medications prior to visit.    ROS: Review of Systems  Constitutional: Negative for activity change, appetite change, chills, fatigue and unexpected weight change.  HENT: Negative for congestion, mouth sores and sinus pressure.   Eyes: Negative for visual disturbance.  Respiratory: Negative for cough and chest tightness.   Gastrointestinal: Negative for abdominal pain and nausea.  Genitourinary: Negative for difficulty urinating, frequency and vaginal pain.  Musculoskeletal: Negative for back pain and gait problem.  Skin: Negative for pallor and rash.  Neurological: Negative for dizziness, tremors, weakness, numbness and headaches.  Psychiatric/Behavioral: Negative for confusion and sleep disturbance.    Objective:  BP 134/80 (BP Location: Left Arm)   Pulse 72   Temp 98.4 F (36.9 C) (Oral)   Wt 147 lb 6.4 oz (66.9 kg)   SpO2 96%   BMI 26.96 kg/m   BP Readings from Last 3 Encounters:  12/12/20 134/80  12/01/19  (!) 142/82  02/16/19 (!) 142/84    Wt Readings from Last 3 Encounters:  12/12/20 147 lb 6.4 oz (66.9 kg)  12/01/19 146 lb (66.2 kg)  02/16/19 149 lb (67.6 kg)    Physical Exam Constitutional:      General: She is not in acute distress.    Appearance: She is well-developed.  HENT:     Head: Normocephalic.     Right Ear: External ear normal.     Left Ear: External ear normal.     Nose: Nose normal.     Mouth/Throat:     Mouth: Oropharynx is clear and moist.  Eyes:     General:        Right eye: No discharge.        Left eye: No discharge.     Conjunctiva/sclera: Conjunctivae normal.     Pupils: Pupils are equal, round, and reactive to light.  Neck:     Thyroid: No thyromegaly.     Vascular: No JVD.     Trachea: No tracheal deviation.  Cardiovascular:     Rate and Rhythm: Normal rate and regular rhythm.     Heart sounds: Normal heart sounds.  Pulmonary:     Effort: No respiratory distress.     Breath sounds: No stridor. No wheezing.  Abdominal:     General: Bowel sounds are normal. There is no distension.     Palpations: Abdomen is soft. There is  no mass.     Tenderness: There is no abdominal tenderness. There is no guarding or rebound.  Musculoskeletal:        General: No tenderness or edema.     Cervical back: Normal range of motion and neck supple.  Lymphadenopathy:     Cervical: No cervical adenopathy.  Skin:    Findings: No erythema or rash.  Neurological:     Mental Status: She is oriented to person, place, and time.     Cranial Nerves: No cranial nerve deficit.     Motor: No abnormal muscle tone.     Coordination: Coordination normal.     Deep Tendon Reflexes: Reflexes normal.  Psychiatric:        Mood and Affect: Mood and affect normal.        Behavior: Behavior normal.        Thought Content: Thought content normal.        Judgment: Judgment normal.     Lab Results  Component Value Date   WBC 5.2 12/03/2019   HGB 14.5 12/03/2019   HCT 43.4  12/03/2019   PLT 254.0 12/03/2019   GLUCOSE 102 (H) 12/03/2019   CHOL 281 (H) 12/03/2019   TRIG 99.0 12/03/2019   HDL 63.80 12/03/2019   LDLDIRECT 160.7 01/09/2012   LDLCALC 197 (H) 12/03/2019   ALT 21 12/03/2019   AST 20 12/03/2019   NA 140 12/03/2019   K 4.5 12/03/2019   CL 105 12/03/2019   CREATININE 0.97 12/03/2019   BUN 14 12/03/2019   CO2 26 12/03/2019   TSH 1.21 12/03/2019    MR Abdomen W Wo Contrast  Result Date: 01/02/2020 CLINICAL DATA:  Renal mass.  History of right renal angiomyolipoma. EXAM: MRI ABDOMEN WITHOUT AND WITH CONTRAST TECHNIQUE: Multiplanar multisequence MR imaging of the abdomen was performed both before and after the administration of intravenous contrast. CONTRAST:  59mL MULTIHANCE GADOBENATE DIMEGLUMINE 529 MG/ML IV SOLN COMPARISON:  CT 04/24/2017 FINDINGS: Lower chest: Unremarkable. Hepatobiliary: 19 mm cavernous hemangioma identified inferior right liver, stable in the nearly 3 year interval since prior study. Tiny gallstone noted without apparent gallbladder wall thickening or pericholecystic fluid no intra or extrahepatic biliary duct dilatation. Pancreas: No focal mass lesion. No dilatation of the main duct. No intraparenchymal cyst. No peripancreatic edema. Spleen:  Visualized portions of the spleen are unremarkable. Adrenals/Urinary Tract:  No adrenal nodule or mass. Exophytic 1.7 x 1.5 cm lesion is identified in the upper pole right kidney, following fat signal intensity on all pulse sequences. After IV contrast administration, no substantial enhancement is identified in the lesion. This corresponds to the fatty lesion seen on previous CT and remains consistent with angiomyolipoma right kidney unremarkable. No suspicious enhancing lesion identified in either kidney. Stomach/Bowel: Stomach is nondistended. Duodenum is normally positioned as is the ligament of Treitz. No small bowel or colonic distension within the visualized abdomen. Vascular/Lymphatic: No  abdominal aortic aneurysm. No abdominal lymphadenopathy Other:  No intraperitoneal free fluid. Musculoskeletal: No abnormal marrow enhancement within the visualized bony anatomy. IMPRESSION: 1. Minimal interval increase in size of the right upper pole renal angiomyolipoma comparing to CT scan from 04/24/2017. 2. Similar appearance of a 19 mm cavernous hemangioma identified inferior right liver. Electronically Signed   By: Misty Stanley M.D.   On: 01/02/2020 11:55    Assessment & Plan:     Walker Kehr, MD

## 2021-01-02 DIAGNOSIS — H25813 Combined forms of age-related cataract, bilateral: Secondary | ICD-10-CM | POA: Diagnosis not present

## 2021-01-02 NOTE — Assessment & Plan Note (Signed)
Will monitor GFR.  Maintain good hydration.

## 2021-02-23 ENCOUNTER — Telehealth: Payer: Self-pay | Admitting: Internal Medicine

## 2021-02-23 NOTE — Telephone Encounter (Signed)
LVM for pt to rtn my call to schedule AWV with [facility]. Please schedule appt if pt calls the office.  

## 2021-03-02 ENCOUNTER — Other Ambulatory Visit: Payer: Self-pay | Admitting: Internal Medicine

## 2021-03-02 ENCOUNTER — Other Ambulatory Visit (INDEPENDENT_AMBULATORY_CARE_PROVIDER_SITE_OTHER): Payer: Medicare Other

## 2021-03-02 DIAGNOSIS — N289 Disorder of kidney and ureter, unspecified: Secondary | ICD-10-CM | POA: Diagnosis not present

## 2021-03-02 DIAGNOSIS — E785 Hyperlipidemia, unspecified: Secondary | ICD-10-CM | POA: Diagnosis not present

## 2021-03-02 DIAGNOSIS — I10 Essential (primary) hypertension: Secondary | ICD-10-CM | POA: Diagnosis not present

## 2021-03-02 LAB — LIPID PANEL
Cholesterol: 252 mg/dL — ABNORMAL HIGH (ref 0–200)
HDL: 71 mg/dL (ref >39.00)
LDL Cholesterol: 163 mg/dL — ABNORMAL HIGH (ref 0–99)
NonHDL: 180.92
Total CHOL/HDL Ratio: 4
Triglycerides: 89 mg/dL (ref 0.0–149.0)
VLDL: 17.8 mg/dL (ref 0.0–40.0)

## 2021-03-02 LAB — URINALYSIS, ROUTINE W REFLEX MICROSCOPIC
Bilirubin Urine: NEGATIVE
Hgb urine dipstick: NEGATIVE
Ketones, ur: NEGATIVE
Nitrite: NEGATIVE
RBC / HPF: NONE SEEN (ref 0–?)
Specific Gravity, Urine: >=1.03 — AB (ref 1.000–1.030)
Total Protein, Urine: NEGATIVE
Urine Glucose: NEGATIVE
Urobilinogen, UA: 0.2 (ref 0.0–1.0)
pH: 6 (ref 5.0–8.0)

## 2021-03-02 LAB — CBC WITH DIFFERENTIAL/PLATELET
Basophils Absolute: 0 10*3/uL (ref 0.0–0.1)
Basophils Relative: 0.6 % (ref 0.0–3.0)
Eosinophils Absolute: 0 10*3/uL (ref 0.0–0.7)
Eosinophils Relative: 0.9 % (ref 0.0–5.0)
HCT: 42.5 % (ref 36.0–46.0)
Hemoglobin: 14.4 g/dL (ref 12.0–15.0)
Lymphocytes Relative: 44.4 % (ref 12.0–46.0)
Lymphs Abs: 2.2 10*3/uL (ref 0.7–4.0)
MCHC: 34 g/dL (ref 30.0–36.0)
MCV: 92.5 fl (ref 78.0–100.0)
Monocytes Absolute: 0.3 10*3/uL (ref 0.1–1.0)
Monocytes Relative: 7 % (ref 3.0–12.0)
Neutro Abs: 2.3 10*3/uL (ref 1.4–7.7)
Neutrophils Relative %: 47.1 % (ref 43.0–77.0)
Platelets: 247 10*3/uL (ref 150.0–400.0)
RBC: 4.59 Mil/uL (ref 3.87–5.11)
RDW: 12.3 % (ref 11.5–15.5)
WBC: 4.9 10*3/uL (ref 4.0–10.5)

## 2021-03-02 LAB — COMPREHENSIVE METABOLIC PANEL
ALT: 21 U/L (ref 0–35)
AST: 15 U/L (ref 0–37)
Albumin: 4.1 g/dL (ref 3.5–5.2)
Alkaline Phosphatase: 78 U/L (ref 39–117)
BUN: 15 mg/dL (ref 6–23)
CO2: 28 mEq/L (ref 19–32)
Calcium: 9.3 mg/dL (ref 8.4–10.5)
Chloride: 106 mEq/L (ref 96–112)
Creatinine, Ser: 0.99 mg/dL (ref 0.40–1.20)
GFR: 56.91 mL/min — ABNORMAL LOW (ref >60.00)
Glucose, Bld: 94 mg/dL (ref 70–99)
Potassium: 4.3 mEq/L (ref 3.5–5.1)
Sodium: 140 mEq/L (ref 135–145)
Total Bilirubin: 0.5 mg/dL (ref 0.2–1.2)
Total Protein: 7.1 g/dL (ref 6.0–8.3)

## 2021-03-02 LAB — TSH: TSH: 0.97 u[IU]/mL (ref 0.35–4.50)

## 2021-03-02 MED ORDER — CEFUROXIME AXETIL 250 MG PO TABS: 250.0000 mg | ORAL_TABLET | Freq: Two times a day (BID) | ORAL | 0 refills | Status: DC

## 2021-03-14 DIAGNOSIS — Z124 Encounter for screening for malignant neoplasm of cervix: Secondary | ICD-10-CM | POA: Diagnosis not present

## 2021-07-17 NOTE — Telephone Encounter (Signed)
Last RF per controlled substance database: 06/08/21 Last OV: 12/12/20 Next OV: none scheduled.

## 2021-07-18 ENCOUNTER — Other Ambulatory Visit: Payer: Self-pay | Admitting: Internal Medicine

## 2021-07-18 MED ORDER — ZOLPIDEM TARTRATE 10 MG PO TABS: ORAL_TABLET | ORAL | 5 refills | Status: DC

## 2021-07-21 ENCOUNTER — Telehealth: Payer: Self-pay | Admitting: *Deleted

## 2021-07-21 NOTE — Telephone Encounter (Signed)
-----   Message from [place] sent at 07/21/2021  3:07 PM EDT ----- Rx refill request

## 2021-07-24 ENCOUNTER — Telehealth: Payer: Self-pay | Admitting: *Deleted

## 2021-07-24 NOTE — Telephone Encounter (Signed)
Pt needing PA for Zolpidem. Completed via cover-my-meds w/ (Key: [id number]) PA Case ID: [id number] - Rx #[id number]. Waiting for determination coverage from insurance../l,mb

## 2021-07-24 NOTE — Telephone Encounter (Signed)
-----   Message from [place] sent at 07/21/2021  3:07 PM EDT ----- Rx refill request

## 2021-07-25 ENCOUNTER — Telehealth: Payer: Self-pay | Admitting: *Deleted

## 2021-07-25 NOTE — Telephone Encounter (Signed)
See phone note for PA on the Zolpidem. Med was approved and pharmacy has been notified.Marland KitchenJohny Chess

## 2021-07-25 NOTE — Telephone Encounter (Signed)
-----   Message from [place] sent at 07/21/2021  3:07 PM EDT ----- Rx refill request

## 2021-07-25 NOTE — Telephone Encounter (Signed)
Rec'd coverage determination.The medication has been approved. Effective 07/24/21 - 02/21/22. Faxed approval b[facility] to [facility]../lmb

## 2021-09-05 DIAGNOSIS — Z23 Encounter for immunization: Secondary | ICD-10-CM | POA: Diagnosis not present

## 2021-09-08 DIAGNOSIS — N8110 Cystocele, unspecified: Secondary | ICD-10-CM | POA: Diagnosis not present

## 2021-09-08 DIAGNOSIS — Z1231 Encounter for screening mammogram for malignant neoplasm of breast: Secondary | ICD-10-CM | POA: Diagnosis not present

## 2021-10-05 DIAGNOSIS — Z23 Encounter for immunization: Secondary | ICD-10-CM | POA: Diagnosis not present

## 2021-10-13 DIAGNOSIS — R82998 Other abnormal findings in urine: Secondary | ICD-10-CM | POA: Diagnosis not present

## 2021-10-13 DIAGNOSIS — N812 Incomplete uterovaginal prolapse: Secondary | ICD-10-CM | POA: Diagnosis not present

## 2021-10-13 DIAGNOSIS — Z9689 Presence of other specified functional implants: Secondary | ICD-10-CM | POA: Diagnosis not present

## 2021-10-20 DIAGNOSIS — R3915 Urgency of urination: Secondary | ICD-10-CM | POA: Diagnosis not present

## 2021-10-20 DIAGNOSIS — Z4689 Encounter for fitting and adjustment of other specified devices: Secondary | ICD-10-CM | POA: Diagnosis not present

## 2021-11-09 DIAGNOSIS — N812 Incomplete uterovaginal prolapse: Secondary | ICD-10-CM | POA: Diagnosis not present

## 2021-11-09 DIAGNOSIS — Z960 Presence of urogenital implants: Secondary | ICD-10-CM | POA: Diagnosis not present

## 2021-11-13 DIAGNOSIS — D251 Intramural leiomyoma of uterus: Secondary | ICD-10-CM | POA: Diagnosis not present

## 2021-11-13 DIAGNOSIS — N8000 Endometriosis of the uterus, unspecified: Secondary | ICD-10-CM | POA: Diagnosis not present

## 2021-11-13 DIAGNOSIS — N812 Incomplete uterovaginal prolapse: Secondary | ICD-10-CM | POA: Diagnosis not present

## 2021-11-13 DIAGNOSIS — N813 Complete uterovaginal prolapse: Secondary | ICD-10-CM | POA: Diagnosis not present

## 2021-12-14 ENCOUNTER — Other Ambulatory Visit: Payer: Self-pay

## 2021-12-14 ENCOUNTER — Encounter: Payer: Self-pay | Admitting: Internal Medicine

## 2021-12-14 ENCOUNTER — Ambulatory Visit (INDEPENDENT_AMBULATORY_CARE_PROVIDER_SITE_OTHER): Payer: Medicare Other | Admitting: Internal Medicine

## 2021-12-14 VITALS — BP 138/92 | HR 77 | Temp 98.7°F | Ht 62.0 in | Wt 135.8 lb

## 2021-12-14 DIAGNOSIS — Z0000 Encounter for general adult medical examination without abnormal findings: Secondary | ICD-10-CM

## 2021-12-14 DIAGNOSIS — N1831 Chronic kidney disease, stage 3a: Secondary | ICD-10-CM

## 2021-12-14 DIAGNOSIS — E559 Vitamin D deficiency, unspecified: Secondary | ICD-10-CM | POA: Diagnosis not present

## 2021-12-14 DIAGNOSIS — N814 Uterovaginal prolapse, unspecified: Secondary | ICD-10-CM | POA: Diagnosis not present

## 2021-12-14 DIAGNOSIS — I129 Hypertensive chronic kidney disease with stage 1 through stage 4 chronic kidney disease, or unspecified chronic kidney disease: Secondary | ICD-10-CM | POA: Diagnosis not present

## 2021-12-14 DIAGNOSIS — I10 Essential (primary) hypertension: Secondary | ICD-10-CM

## 2021-12-14 LAB — CBC WITH DIFFERENTIAL/PLATELET
Basophils Absolute: 0 10*3/uL (ref 0.0–0.1)
Basophils Relative: 0.8 % (ref 0.0–3.0)
Eosinophils Absolute: 0.1 10*3/uL (ref 0.0–0.7)
Eosinophils Relative: 1.8 % (ref 0.0–5.0)
HCT: 39 % (ref 36.0–46.0)
Hemoglobin: 12.8 g/dL (ref 12.0–15.0)
Lymphocytes Relative: 31.1 % (ref 12.0–46.0)
Lymphs Abs: 1.4 10*3/uL (ref 0.7–4.0)
MCHC: 32.9 g/dL (ref 30.0–36.0)
MCV: 93.4 fl (ref 78.0–100.0)
Monocytes Absolute: 0.4 10*3/uL (ref 0.1–1.0)
Monocytes Relative: 8.7 % (ref 3.0–12.0)
Neutro Abs: 2.6 10*3/uL (ref 1.4–7.7)
Neutrophils Relative %: 57.6 % (ref 43.0–77.0)
Platelets: 274 10*3/uL (ref 150.0–400.0)
RBC: 4.17 Mil/uL (ref 3.87–5.11)
RDW: 12.9 % (ref 11.5–15.5)
WBC: 4.5 10*3/uL (ref 4.0–10.5)

## 2021-12-14 LAB — LIPID PANEL
Cholesterol: 250 mg/dL — ABNORMAL HIGH (ref 0–200)
HDL: 66.1 mg/dL (ref >39.00)
LDL Cholesterol: 167 mg/dL — ABNORMAL HIGH (ref 0–99)
NonHDL: 184.18
Total CHOL/HDL Ratio: 4
Triglycerides: 86 mg/dL (ref 0.0–149.0)
VLDL: 17.2 mg/dL (ref 0.0–40.0)

## 2021-12-14 LAB — COMPREHENSIVE METABOLIC PANEL
ALT: 15 U/L (ref 0–35)
AST: 18 U/L (ref 0–37)
Albumin: 4.1 g/dL (ref 3.5–5.2)
Alkaline Phosphatase: 83 U/L (ref 39–117)
BUN: 11 mg/dL (ref 6–23)
CO2: 28 mEq/L (ref 19–32)
Calcium: 9.4 mg/dL (ref 8.4–10.5)
Chloride: 106 mEq/L (ref 96–112)
Creatinine, Ser: 0.95 mg/dL (ref 0.40–1.20)
GFR: 59.47 mL/min — ABNORMAL LOW (ref >60.00)
Glucose, Bld: 104 mg/dL — ABNORMAL HIGH (ref 70–99)
Potassium: 4.4 mEq/L (ref 3.5–5.1)
Sodium: 141 mEq/L (ref 135–145)
Total Bilirubin: 0.6 mg/dL (ref 0.2–1.2)
Total Protein: 7 g/dL (ref 6.0–8.3)

## 2021-12-14 LAB — URINALYSIS, ROUTINE W REFLEX MICROSCOPIC
Bilirubin Urine: NEGATIVE
Ketones, ur: NEGATIVE
Nitrite: NEGATIVE
Specific Gravity, Urine: 1.02 (ref 1.000–1.030)
Total Protein, Urine: NEGATIVE
Urine Glucose: NEGATIVE
Urobilinogen, UA: 0.2 (ref 0.0–1.0)
pH: 5.5 (ref 5.0–8.0)

## 2021-12-14 LAB — TSH: TSH: 1.24 u[IU]/mL (ref 0.35–5.50)

## 2021-12-14 MED ORDER — ZOLPIDEM TARTRATE 10 MG PO TABS: ORAL_TABLET | ORAL | 1 refills | Status: DC

## 2021-12-14 MED ORDER — LOSARTAN POTASSIUM 50 MG PO TABS: 50.0000 mg | ORAL_TABLET | Freq: Every day | ORAL | 3 refills | Status: DC

## 2021-12-14 NOTE — Assessment & Plan Note (Signed)
On Vit d 

## 2021-12-14 NOTE — Patient Instructions (Signed)
For a mild COVID-19 case - take zinc 50 mg a day for 1 week, vitamin C 1000 mg daily for 1 week, vitamin D2 50,000 units weekly for 2 months (unless  taking vitamin D daily already), an antioxidant Quercetin 500 mg twice a day for 1 week (if you can get it quick enough). Take Allegra or Benadryl.  Maintain good oral hydration and take Tylenol for high fever.  Call if problems. Isolate for 5 days, then wear a mask for 5 days per CDC.  

## 2021-12-14 NOTE — Assessment & Plan Note (Addendum)
On  Losartan.  Continue with losartan.  Let me know blood pressure remains elevated

## 2021-12-14 NOTE — Progress Notes (Signed)
Subjective:  Patient ID: Olivia Odom, female    DOB: [birth year]  Age: 75 y.o. MRN: [id number]  CC: Annual Exam   HPI Olivia Odom presents for a well exam S/p uterine prolapse - - total hysterectomy in Nov 2022. Pt lost wt after surgery  Outpatient Medications Prior to Visit  Medication Sig Dispense Refill   estradiol (ESTRACE) 0.1 MG/GM vaginal cream Apply fingertip amount night for 2 weeks, then apply every other night indefinitely     Cholecalciferol (VITAMIN D3) 50 MCG (2000 UT) capsule Take 1 capsule (2,000 Units total) by mouth daily. 100 capsule 3   cefUROXime (CEFTIN) 250 MG tablet Take 1 tablet (250 mg total) by mouth 2 (two) times daily with a meal. (Patient not taking: Reported on 12/14/2021) 8 tablet 0   losartan (COZAAR) 50 MG tablet Take 1 tablet (50 mg total) by mouth daily. 90 tablet 3   ondansetron (ZOFRAN) 4 MG tablet Take 1 tablet (4 mg total) by mouth every 8 (eight) hours as needed for nausea or vomiting. (Patient not taking: Reported on 12/14/2021) 20 tablet 0   zolpidem (AMBIEN) 10 MG tablet 1 po qhs prn 30 tablet 5   No facility-administered medications prior to visit.    ROS: Review of Systems  Constitutional:  Negative for activity change, appetite change, chills, fatigue and unexpected weight change.  HENT:  Negative for congestion, mouth sores and sinus pressure.   Eyes:  Negative for visual disturbance.  Respiratory:  Negative for cough and chest tightness.   Gastrointestinal:  Negative for abdominal pain and nausea.  Genitourinary:  Negative for difficulty urinating, frequency and vaginal pain.  Musculoskeletal:  Negative for back pain and gait problem.  Skin:  Negative for pallor and rash.  Neurological:  Negative for dizziness, tremors, weakness, numbness and headaches.  Psychiatric/Behavioral:  Negative for confusion and sleep disturbance. The patient is not nervous/anxious.    Objective:  BP (!) 138/92 (BP Location: Left Arm)    Pulse 77     Temp 98.7 F (37.1 C) (Oral)    Ht 5\' 2"  (1.575 m)    Wt 135 lb 12.8 oz (61.6 kg)    SpO2 97%    BMI 24.84 kg/m   BP Readings from Last 3 Encounters:  12/14/21 (!) 138/92  12/12/20 134/80  12/01/19 (!) 142/82    Wt Readings from Last 3 Encounters:  12/14/21 135 lb 12.8 oz (61.6 kg)  12/12/20 147 lb 6.4 oz (66.9 kg)  12/01/19 146 lb (66.2 kg)    Physical Exam Constitutional:      General: She is not in acute distress.    Appearance: She is well-developed.  HENT:     Head: Normocephalic.     Right Ear: External ear normal.     Left Ear: External ear normal.     Nose: Nose normal.  Eyes:     General:        Right eye: No discharge.        Left eye: No discharge.     Conjunctiva/sclera: Conjunctivae normal.     Pupils: Pupils are equal, round, and reactive to light.  Neck:     Thyroid: No thyromegaly.     Vascular: No JVD.     Trachea: No tracheal deviation.  Cardiovascular:     Rate and Rhythm: Normal rate and regular rhythm.     Heart sounds: Normal heart sounds.  Pulmonary:     Effort: No respiratory distress.  Breath sounds: No stridor. No wheezing.  Abdominal:     General: Bowel sounds are normal. There is no distension.     Palpations: Abdomen is soft. There is no mass.     Tenderness: There is no abdominal tenderness. There is no guarding or rebound.  Musculoskeletal:        General: No tenderness.     Cervical back: Normal range of motion and neck supple. No rigidity.  Lymphadenopathy:     Cervical: No cervical adenopathy.  Skin:    Findings: No erythema or rash.  Neurological:     Cranial Nerves: No cranial nerve deficit.     Motor: No abnormal muscle tone.     Coordination: Coordination normal.     Deep Tendon Reflexes: Reflexes normal.  Psychiatric:        Behavior: Behavior normal.        Thought Content: Thought content normal.        Judgment: Judgment normal.    Lab Results  Component Value Date   WBC 4.5 12/14/2021   HGB 12.8  12/14/2021   HCT 39.0 12/14/2021   PLT 274.0 12/14/2021   GLUCOSE 104 (H) 12/14/2021   CHOL 250 (H) 12/14/2021   TRIG 86.0 12/14/2021   HDL 66.10 12/14/2021   LDLDIRECT 160.7 01/09/2012   LDLCALC 167 (H) 12/14/2021   ALT 15 12/14/2021   AST 18 12/14/2021   NA 141 12/14/2021   K 4.4 12/14/2021   CL 106 12/14/2021   CREATININE 0.95 12/14/2021   BUN 11 12/14/2021   CO2 28 12/14/2021   TSH 1.24 12/14/2021    MR Abdomen W Wo Contrast  Result Date: 01/02/2020 CLINICAL DATA:  Renal mass.  History of right renal angiomyolipoma. EXAM: MRI ABDOMEN WITHOUT AND WITH CONTRAST TECHNIQUE: Multiplanar multisequence MR imaging of the abdomen was performed both before and after the administration of intravenous contrast. CONTRAST:  88mL MULTIHANCE GADOBENATE DIMEGLUMINE 529 MG/ML IV SOLN COMPARISON:  CT 04/24/2017 FINDINGS: Lower chest: Unremarkable. Hepatobiliary: 19 mm cavernous hemangioma identified inferior right liver, stable in the nearly 3 year interval since prior study. Tiny gallstone noted without apparent gallbladder wall thickening or pericholecystic fluid no intra or extrahepatic biliary duct dilatation. Pancreas: No focal mass lesion. No dilatation of the main duct. No intraparenchymal cyst. No peripancreatic edema. Spleen:  Visualized portions of the spleen are unremarkable. Adrenals/Urinary Tract:  No adrenal nodule or mass. Exophytic 1.7 x 1.5 cm lesion is identified in the upper pole right kidney, following fat signal intensity on all pulse sequences. After IV contrast administration, no substantial enhancement is identified in the lesion. This corresponds to the fatty lesion seen on previous CT and remains consistent with angiomyolipoma right kidney unremarkable. No suspicious enhancing lesion identified in either kidney. Stomach/Bowel: Stomach is nondistended. Duodenum is normally positioned as is the ligament of Treitz. No small bowel or colonic distension within the visualized abdomen.  Vascular/Lymphatic: No abdominal aortic aneurysm. No abdominal lymphadenopathy Other:  No intraperitoneal free fluid. Musculoskeletal: No abnormal marrow enhancement within the visualized bony anatomy. IMPRESSION: 1. Minimal interval increase in size of the right upper pole renal angiomyolipoma comparing to CT scan from 04/24/2017. 2. Similar appearance of a 19 mm cavernous hemangioma identified inferior right liver. Electronically Signed   By: Misty Stanley M.D.   On: 01/02/2020 11:55    Assessment & Plan:   Problem List Items Addressed This Visit     Chronic kidney disease (CKD) stage G3a/A1, moderately decreased glomerular filtration rate (GFR) between  45-59 mL/min/1.73 square meter and albuminuria creatinine ratio less than 30 mg/g (HCC)    Monitoring GFR Good hydration      Relevant Orders   TSH (Completed)   Urinalysis   CBC with Differential/Platelet (Completed)   Lipid panel (Completed)   Comprehensive metabolic panel (Completed)   HTN (hypertension)    On  Losartan.  Continue with losartan.  Let me know blood pressure remains elevated      Relevant Medications   losartan (COZAAR) 50 MG tablet   Other Relevant Orders   TSH (Completed)   Urinalysis   CBC with Differential/Platelet (Completed)   Lipid panel (Completed)   Comprehensive metabolic panel (Completed)   Uterine prolapse    S/p uterine prolapse - - total hysterectomy in Nov 2022. Pt lost wt      Vitamin D deficiency    On Vit d      Well adult exam     We discussed age appropriate health related issues, including available/recomended screening tests and vaccinations. Labs were ordered to be later reviewed . All questions were answered. We discussed one or more of the following - seat belt use, use of sunscreen/sun exposure exercise, fall risk reduction, second hand smoke exposure, firearm use and storage, seat belt use, a need for adhering to healthy diet and exercise. Labs were ordered.  All questions were  answered.        Relevant Orders   TSH (Completed)   Urinalysis   CBC with Differential/Platelet (Completed)   Lipid panel (Completed)   Comprehensive metabolic panel (Completed)      Meds ordered this encounter  Medications   zolpidem (AMBIEN) 10 MG tablet    Sig: 1 po qhs prn    Dispense:  90 tablet    Refill:  1   losartan (COZAAR) 50 MG tablet    Sig: Take 1 tablet (50 mg total) by mouth daily.    Dispense:  90 tablet    Refill:  3      Follow-up: Return in about 6 months (around 06/14/2022) for a follow-up visit.  Walker Kehr, MD

## 2021-12-14 NOTE — Assessment & Plan Note (Signed)
Monitoring GFR Good hydration

## 2021-12-14 NOTE — Assessment & Plan Note (Signed)

## 2021-12-14 NOTE — Assessment & Plan Note (Signed)
S/p uterine prolapse - - total hysterectomy in Nov 2022. Pt lost wt

## 2021-12-18 ENCOUNTER — Other Ambulatory Visit: Payer: Self-pay | Admitting: Internal Medicine

## 2021-12-18 MED ORDER — CEPHALEXIN 500 MG PO CAPS: 500.0000 mg | ORAL_CAPSULE | Freq: Four times a day (QID) | ORAL | 1 refills | Status: DC

## 2021-12-25 ENCOUNTER — Encounter: Payer: Self-pay | Admitting: Internal Medicine

## 2022-01-02 DIAGNOSIS — R82998 Other abnormal findings in urine: Secondary | ICD-10-CM | POA: Diagnosis not present

## 2022-01-02 DIAGNOSIS — Z9889 Other specified postprocedural states: Secondary | ICD-10-CM | POA: Diagnosis not present

## 2022-01-16 DIAGNOSIS — H25813 Combined forms of age-related cataract, bilateral: Secondary | ICD-10-CM | POA: Diagnosis not present

## 2022-01-16 DIAGNOSIS — H02889 Meibomian gland dysfunction of unspecified eye, unspecified eyelid: Secondary | ICD-10-CM | POA: Diagnosis not present

## 2022-01-16 DIAGNOSIS — H35363 Drusen (degenerative) of macula, bilateral: Secondary | ICD-10-CM | POA: Diagnosis not present

## 2022-02-08 DIAGNOSIS — N393 Stress incontinence (female) (male): Secondary | ICD-10-CM | POA: Diagnosis not present

## 2022-03-27 DIAGNOSIS — H02403 Unspecified ptosis of bilateral eyelids: Secondary | ICD-10-CM | POA: Diagnosis not present

## 2022-03-27 DIAGNOSIS — H02831 Dermatochalasis of right upper eyelid: Secondary | ICD-10-CM | POA: Diagnosis not present

## 2022-03-27 DIAGNOSIS — H02834 Dermatochalasis of left upper eyelid: Secondary | ICD-10-CM | POA: Diagnosis not present

## 2022-04-21 DIAGNOSIS — Z23 Encounter for immunization: Secondary | ICD-10-CM | POA: Diagnosis not present

## 2022-04-23 DIAGNOSIS — W57XXXA Bitten or stung by nonvenomous insect and other nonvenomous arthropods, initial encounter: Secondary | ICD-10-CM | POA: Diagnosis not present

## 2022-04-23 DIAGNOSIS — S1096XA Insect bite of unspecified part of neck, initial encounter: Secondary | ICD-10-CM | POA: Diagnosis not present

## 2022-05-10 DIAGNOSIS — N393 Stress incontinence (female) (male): Secondary | ICD-10-CM | POA: Diagnosis not present

## 2022-06-07 ENCOUNTER — Other Ambulatory Visit: Payer: Self-pay | Admitting: Internal Medicine

## 2022-06-07 NOTE — Telephone Encounter (Signed)
Check [place] registry last filled 04/06/2022../lmb  

## 2022-06-14 DIAGNOSIS — H02403 Unspecified ptosis of bilateral eyelids: Secondary | ICD-10-CM | POA: Diagnosis not present

## 2022-06-14 DIAGNOSIS — H02831 Dermatochalasis of right upper eyelid: Secondary | ICD-10-CM | POA: Diagnosis not present

## 2022-06-14 DIAGNOSIS — H02834 Dermatochalasis of left upper eyelid: Secondary | ICD-10-CM | POA: Diagnosis not present

## 2022-06-19 ENCOUNTER — Ambulatory Visit (INDEPENDENT_AMBULATORY_CARE_PROVIDER_SITE_OTHER): Payer: Medicare Other | Admitting: Internal Medicine

## 2022-06-19 ENCOUNTER — Encounter: Payer: Self-pay | Admitting: Internal Medicine

## 2022-06-19 VITALS — BP 122/80 | HR 64 | Temp 98.0°F | Ht 62.0 in | Wt 140.0 lb

## 2022-06-19 DIAGNOSIS — N1831 Chronic kidney disease, stage 3a: Secondary | ICD-10-CM | POA: Diagnosis not present

## 2022-06-19 DIAGNOSIS — E785 Hyperlipidemia, unspecified: Secondary | ICD-10-CM | POA: Diagnosis not present

## 2022-06-19 DIAGNOSIS — R739 Hyperglycemia, unspecified: Secondary | ICD-10-CM

## 2022-06-19 DIAGNOSIS — E559 Vitamin D deficiency, unspecified: Secondary | ICD-10-CM | POA: Diagnosis not present

## 2022-06-19 DIAGNOSIS — G4700 Insomnia, unspecified: Secondary | ICD-10-CM | POA: Diagnosis not present

## 2022-06-21 LAB — COMPREHENSIVE METABOLIC PANEL
ALT: 20 U/L (ref 0–35)
AST: 21 U/L (ref 0–37)
Albumin: 4.4 g/dL (ref 3.5–5.2)
Alkaline Phosphatase: 79 U/L (ref 39–117)
BUN: 15 mg/dL (ref 6–23)
CO2: 28 mEq/L (ref 19–32)
Calcium: 9.5 mg/dL (ref 8.4–10.5)
Chloride: 102 mEq/L (ref 96–112)
Creatinine, Ser: 0.91 mg/dL (ref 0.40–1.20)
GFR: 62.39 mL/min (ref >60.00)
Glucose, Bld: 108 mg/dL — ABNORMAL HIGH (ref 70–99)
Potassium: 4.7 mEq/L (ref 3.5–5.1)
Sodium: 136 mEq/L (ref 135–145)
Total Bilirubin: 0.4 mg/dL (ref 0.2–1.2)
Total Protein: 7.1 g/dL (ref 6.0–8.3)

## 2022-06-21 LAB — CBC WITH DIFFERENTIAL/PLATELET
Basophils Absolute: 0.1 10*3/uL (ref 0.0–0.1)
Basophils Relative: 1.1 % (ref 0.0–3.0)
Eosinophils Absolute: 0.1 10*3/uL (ref 0.0–0.7)
Eosinophils Relative: 2.4 % (ref 0.0–5.0)
HCT: 41.5 % (ref 36.0–46.0)
Hemoglobin: 14 g/dL (ref 12.0–15.0)
Lymphocytes Relative: 42.6 % (ref 12.0–46.0)
Lymphs Abs: 2.2 10*3/uL (ref 0.7–4.0)
MCHC: 33.8 g/dL (ref 30.0–36.0)
MCV: 95.8 fl (ref 78.0–100.0)
Monocytes Absolute: 0.4 10*3/uL (ref 0.1–1.0)
Monocytes Relative: 7.5 % (ref 3.0–12.0)
Neutro Abs: 2.4 10*3/uL (ref 1.4–7.7)
Neutrophils Relative %: 46.4 % (ref 43.0–77.0)
Platelets: 260 10*3/uL (ref 150.0–400.0)
RBC: 4.33 Mil/uL (ref 3.87–5.11)
RDW: 13.6 % (ref 11.5–15.5)
WBC: 5.3 10*3/uL (ref 4.0–10.5)

## 2022-06-21 LAB — HEMOGLOBIN A1C: Hgb A1c MFr Bld: 5.6 % (ref 4.6–6.5)

## 2022-06-21 LAB — LIPID PANEL
Cholesterol: 258 mg/dL — ABNORMAL HIGH (ref 0–200)
HDL: 64.3 mg/dL (ref >39.00)
LDL Cholesterol: 174 mg/dL — ABNORMAL HIGH (ref 0–99)
NonHDL: 193.22
Total CHOL/HDL Ratio: 4
Triglycerides: 96 mg/dL (ref 0.0–149.0)
VLDL: 19.2 mg/dL (ref 0.0–40.0)

## 2022-08-30 ENCOUNTER — Other Ambulatory Visit: Payer: Self-pay | Admitting: Internal Medicine

## 2022-08-30 NOTE — Telephone Encounter (Signed)
Check [place] registry last filled 07/02/2022. MD out of the office pls advise on refill.Marland KitchenJohny Chess

## 2022-09-13 DIAGNOSIS — Z23 Encounter for immunization: Secondary | ICD-10-CM | POA: Diagnosis not present

## 2022-09-18 DIAGNOSIS — Z1231 Encounter for screening mammogram for malignant neoplasm of breast: Secondary | ICD-10-CM | POA: Diagnosis not present

## 2022-12-22 ENCOUNTER — Other Ambulatory Visit: Payer: Self-pay | Admitting: Internal Medicine

## 2023-01-01 ENCOUNTER — Encounter: Payer: Self-pay | Admitting: Internal Medicine

## 2023-01-01 ENCOUNTER — Ambulatory Visit: Payer: Medicare Other | Admitting: Internal Medicine

## 2023-01-01 VITALS — BP 138/84 | HR 75 | Temp 98.0°F | Ht 62.0 in | Wt 143.0 lb

## 2023-01-01 DIAGNOSIS — H66001 Acute suppurative otitis media without spontaneous rupture of ear drum, right ear: Secondary | ICD-10-CM

## 2023-01-01 DIAGNOSIS — N1831 Chronic kidney disease, stage 3a: Secondary | ICD-10-CM | POA: Diagnosis not present

## 2023-01-01 DIAGNOSIS — I10 Essential (primary) hypertension: Secondary | ICD-10-CM | POA: Diagnosis not present

## 2023-01-01 LAB — CBC WITH DIFFERENTIAL/PLATELET
Basophils Absolute: 0 10*3/uL (ref 0.0–0.1)
Basophils Relative: 0.4 % (ref 0.0–3.0)
Eosinophils Absolute: 0 10*3/uL (ref 0.0–0.7)
Eosinophils Relative: 0.6 % (ref 0.0–5.0)
HCT: 43.4 % (ref 36.0–46.0)
Hemoglobin: 14.7 g/dL (ref 12.0–15.0)
Lymphocytes Relative: 37.8 % (ref 12.0–46.0)
Lymphs Abs: 2.5 10*3/uL (ref 0.7–4.0)
MCHC: 33.8 g/dL (ref 30.0–36.0)
MCV: 95 fl (ref 78.0–100.0)
Monocytes Absolute: 0.6 10*3/uL (ref 0.1–1.0)
Monocytes Relative: 8.5 % (ref 3.0–12.0)
Neutro Abs: 3.5 10*3/uL (ref 1.4–7.7)
Neutrophils Relative %: 52.7 % (ref 43.0–77.0)
Platelets: 290 10*3/uL (ref 150.0–400.0)
RBC: 4.57 Mil/uL (ref 3.87–5.11)
RDW: 13 % (ref 11.5–15.5)
WBC: 6.6 10*3/uL (ref 4.0–10.5)

## 2023-01-01 LAB — COMPREHENSIVE METABOLIC PANEL WITH GFR
ALT: 20 U/L (ref 0–35)
AST: 19 U/L (ref 0–37)
Albumin: 4.3 g/dL (ref 3.5–5.2)
Alkaline Phosphatase: 75 U/L (ref 39–117)
BUN: 19 mg/dL (ref 6–23)
CO2: 29 meq/L (ref 19–32)
Calcium: 9.3 mg/dL (ref 8.4–10.5)
Chloride: 102 meq/L (ref 96–112)
Creatinine, Ser: 0.94 mg/dL (ref 0.40–1.20)
GFR: 59.79 mL/min — ABNORMAL LOW
Glucose, Bld: 98 mg/dL (ref 70–99)
Potassium: 4.6 meq/L (ref 3.5–5.1)
Sodium: 138 meq/L (ref 135–145)
Total Bilirubin: 0.4 mg/dL (ref 0.2–1.2)
Total Protein: 7.1 g/dL (ref 6.0–8.3)

## 2023-01-01 MED ORDER — LOSARTAN POTASSIUM 100 MG PO TABS: 100.0000 mg | ORAL_TABLET | Freq: Every day | ORAL | 3 refills | Status: DC

## 2023-01-01 MED ORDER — ZOLPIDEM TARTRATE 10 MG PO TABS: ORAL_TABLET | ORAL | 1 refills | Status: DC

## 2023-01-01 MED ORDER — FLUCONAZOLE 150 MG PO TABS: 150.0000 mg | ORAL_TABLET | Freq: Once | ORAL | 1 refills | Status: AC

## 2023-01-01 MED ORDER — AZITHROMYCIN 250 MG PO TABS: ORAL_TABLET | ORAL | 0 refills | Status: DC

## 2023-01-01 MED ORDER — LOSARTAN POTASSIUM 50 MG PO TABS: 50.0000 mg | ORAL_TABLET | Freq: Every day | ORAL | 3 refills | Status: DC

## 2023-01-01 NOTE — Progress Notes (Signed)
Subjective:  Patient ID: Olivia Odom, female    DOB: 08-22-1946  Age: 77 y.o. MRN: [id number]  CC: Annual Exam (Wondering if Olivia Odom is a good bp medication)   HPI Olivia Odom presents for BP. SBP - low 130s.  Follow-up on chronic renal insufficiency. C/o pain in the R ear since her trip to the mountains in [place]  Outpatient Medications Prior to Visit  Medication Sig Dispense Refill   Cholecalciferol (VITAMIN D3) 50 MCG (2000 UT) capsule Take 1 capsule (2,000 Units total) by mouth daily. 100 capsule 3   EPINEPHrine 0.3 mg/0.3 mL IJ SOAJ injection Inject into the muscle.     estradiol (ESTRACE) 0.1 MG/GM vaginal cream Apply fingertip amount night for 2 weeks, then apply every other night indefinitely     losartan (COZAAR) 50 MG tablet TAKE 1 TABLET BY MOUTH EVERY DAY 90 tablet 3   zolpidem (AMBIEN) 10 MG tablet TAKE 1 TABLET BY MOUTH EVERY DAY AT BEDTIME AS NEEDED 90 tablet 0   No facility-administered medications prior to visit.    ROS: Review of Systems  Constitutional:  Negative for activity change, appetite change, chills, fatigue and unexpected weight change.  HENT:  Negative for congestion, mouth sores and sinus pressure.   Eyes:  Negative for visual disturbance.  Respiratory:  Negative for cough and chest tightness.   Gastrointestinal:  Negative for abdominal pain and nausea.  Genitourinary:  Negative for difficulty urinating, frequency and vaginal pain.  Musculoskeletal:  Negative for back pain and gait problem.  Skin:  Negative for pallor and rash.  Neurological:  Negative for dizziness, tremors, weakness, numbness and headaches.  Psychiatric/Behavioral:  Negative for confusion and sleep disturbance.     Objective:  BP 138/84 (BP Location: Right Arm, Patient Position: Sitting, Cuff Size: Normal)   Pulse 75   Temp 98 F (36.7 C) (Oral)   Ht '5\' 2"'$  (1.575 m)   Wt 143 lb (64.9 kg)   SpO2 98%   BMI 26.16 kg/m   BP Readings from Last 3 Encounters:   01/01/23 138/84  06/19/22 122/80  12/14/21 (!) 138/92    Wt Readings from Last 3 Encounters:  01/01/23 143 lb (64.9 kg)  06/19/22 140 lb (63.5 kg)  12/14/21 135 lb 12.8 oz (61.6 kg)    Physical Exam Constitutional:      General: She is not in acute distress.    Appearance: She is well-developed.  HENT:     Head: Normocephalic.     Right Ear: External ear normal.     Left Ear: External ear normal.     Nose: Nose normal.  Eyes:     General:        Right eye: No discharge.        Left eye: No discharge.     Conjunctiva/sclera: Conjunctivae normal.     Pupils: Pupils are equal, round, and reactive to light.  Neck:     Thyroid: No thyromegaly.     Vascular: No JVD.     Trachea: No tracheal deviation.  Cardiovascular:     Rate and Rhythm: Normal rate and regular rhythm.     Heart sounds: Normal heart sounds.  Pulmonary:     Effort: No respiratory distress.     Breath sounds: No stridor. No wheezing.  Abdominal:     General: Bowel sounds are normal. There is no distension.     Palpations: Abdomen is soft. There is no mass.     Tenderness: There is  no abdominal tenderness. There is no guarding or rebound.  Musculoskeletal:        General: No tenderness.     Cervical back: Normal range of motion and neck supple. No rigidity.  Lymphadenopathy:     Cervical: No cervical adenopathy.  Skin:    Findings: No erythema or rash.  Neurological:     Cranial Nerves: No cranial nerve deficit.     Motor: No abnormal muscle tone.     Coordination: Coordination normal.     Deep Tendon Reflexes: Reflexes normal.  Psychiatric:        Behavior: Behavior normal.        Thought Content: Thought content normal.        Judgment: Judgment normal.     Lab Results  Component Value Date   WBC 6.6 01/01/2023   HGB 14.7 01/01/2023   HCT 43.4 01/01/2023   PLT 290.0 01/01/2023   GLUCOSE 98 01/01/2023   CHOL 258 (H) 06/21/2022   TRIG 96.0 06/21/2022   HDL 64.30 06/21/2022   LDLDIRECT  160.7 01/09/2012   LDLCALC 174 (H) 06/21/2022   ALT 20 01/01/2023   AST 19 01/01/2023   NA 138 01/01/2023   K 4.6 01/01/2023   CL 102 01/01/2023   CREATININE 0.94 01/01/2023   BUN 19 01/01/2023   CO2 29 01/01/2023   TSH 1.24 12/14/2021   HGBA1C 5.6 06/21/2022    MR Abdomen W Wo Contrast  Result Date: 01/02/2020 CLINICAL DATA:  Renal mass.  History of right renal angiomyolipoma. EXAM: MRI ABDOMEN WITHOUT AND WITH CONTRAST TECHNIQUE: Multiplanar multisequence MR imaging of the abdomen was performed both before and after the administration of intravenous contrast. CONTRAST:  85m MULTIHANCE GADOBENATE DIMEGLUMINE 529 MG/ML IV SOLN COMPARISON:  CT 04/24/2017 FINDINGS: Lower chest: Unremarkable. Hepatobiliary: 19 mm cavernous hemangioma identified inferior right liver, stable in the nearly 3 year interval since prior study. Tiny gallstone noted without apparent gallbladder wall thickening or pericholecystic fluid no intra or extrahepatic biliary duct dilatation. Pancreas: No focal mass lesion. No dilatation of the main duct. No intraparenchymal cyst. No peripancreatic edema. Spleen:  Visualized portions of the spleen are unremarkable. Adrenals/Urinary Tract:  No adrenal nodule or mass. Exophytic 1.7 x 1.5 cm lesion is identified in the upper pole right kidney, following fat signal intensity on all pulse sequences. After IV contrast administration, no substantial enhancement is identified in the lesion. This corresponds to the fatty lesion seen on previous CT and remains consistent with angiomyolipoma right kidney unremarkable. No suspicious enhancing lesion identified in either kidney. Stomach/Bowel: Stomach is nondistended. Duodenum is normally positioned as is the ligament of Treitz. No small bowel or colonic distension within the visualized abdomen. Vascular/Lymphatic: No abdominal aortic aneurysm. No abdominal lymphadenopathy Other:  No intraperitoneal free fluid. Musculoskeletal: No abnormal marrow  enhancement within the visualized bony anatomy. IMPRESSION: 1. Minimal interval increase in size of the right upper pole renal angiomyolipoma comparing to CT scan from 04/24/2017. 2. Similar appearance of a 19 mm cavernous hemangioma identified inferior right liver. Electronically Signed   By: EMisty StanleyM.D.   On: 01/02/2020 11:55    Assessment & Plan:   Problem List Items Addressed This Visit       Cardiovascular and Mediastinum   HTN (hypertension) - Primary    Losartan - increase to 100 mg/d       Relevant Medications   losartan (COZAAR) 100 MG tablet   losartan (COZAAR) 50 MG tablet   Other Relevant Orders  CBC with Differential/Platelet (Completed)     Nervous and Auditory   Otitis media    R OM Zpac Diflucan if needed        Relevant Medications   azithromycin (ZITHROMAX Z-PAK) 250 MG tablet   Other Relevant Orders   CBC with Differential/Platelet (Completed)     Genitourinary   Chronic kidney disease (CKD) stage G3a/A1, moderately decreased glomerular filtration rate (GFR) between 45-59 mL/min/1.73 square meter and albuminuria creatinine ratio less than 30 mg/g (HCC)    Check CMET      Relevant Orders   CBC with Differential/Platelet (Completed)   Comprehensive metabolic panel (Completed)      Meds ordered this encounter  Medications   losartan (COZAAR) 100 MG tablet    Sig: Take 1 tablet (100 mg total) by mouth daily.    Dispense:  90 tablet    Refill:  3   azithromycin (ZITHROMAX Z-PAK) 250 MG tablet    Sig: As directed    Dispense:  6 tablet    Refill:  0   fluconazole (DIFLUCAN) 150 MG tablet    Sig: Take 1 tablet (150 mg total) by mouth once for 1 dose.    Dispense:  1 tablet    Refill:  1   losartan (COZAAR) 50 MG tablet    Sig: Take 1 tablet (50 mg total) by mouth daily.    Dispense:  90 tablet    Refill:  3   zolpidem (AMBIEN) 10 MG tablet    Sig: TAKE 1 TABLET BY MOUTH EVERY DAY AT BEDTIME AS NEEDED    Dispense:  90 tablet     Refill:  1    Not to exceed 3 additional fills before 12/11/2022      Follow-up: Return in about 6 months (around 07/02/2023).  Walker Kehr, MD

## 2023-01-01 NOTE — Assessment & Plan Note (Signed)
Check CMET. 

## 2023-01-01 NOTE — Assessment & Plan Note (Signed)
Losartan - increase to 100 mg/d

## 2023-01-01 NOTE — Assessment & Plan Note (Signed)
R OM Zpac Diflucan if needed

## 2023-01-02 ENCOUNTER — Ambulatory Visit: Payer: PRIVATE HEALTH INSURANCE

## 2023-01-21 DIAGNOSIS — H35363 Drusen (degenerative) of macula, bilateral: Secondary | ICD-10-CM | POA: Diagnosis not present

## 2023-01-21 DIAGNOSIS — H25813 Combined forms of age-related cataract, bilateral: Secondary | ICD-10-CM | POA: Diagnosis not present

## 2023-03-06 DIAGNOSIS — H6991 Unspecified Eustachian tube disorder, right ear: Secondary | ICD-10-CM | POA: Diagnosis not present

## 2023-03-28 DIAGNOSIS — Z124 Encounter for screening for malignant neoplasm of cervix: Secondary | ICD-10-CM | POA: Diagnosis not present

## 2023-03-28 DIAGNOSIS — R87610 Atypical squamous cells of undetermined significance on cytologic smear of cervix (ASC-US): Secondary | ICD-10-CM | POA: Diagnosis not present

## 2023-04-09 DIAGNOSIS — H90A22 Sensorineural hearing loss, unilateral, left ear, with restricted hearing on the contralateral side: Secondary | ICD-10-CM | POA: Diagnosis not present

## 2023-04-09 DIAGNOSIS — H903 Sensorineural hearing loss, bilateral: Secondary | ICD-10-CM | POA: Diagnosis not present

## 2023-04-09 DIAGNOSIS — H6122 Impacted cerumen, left ear: Secondary | ICD-10-CM | POA: Diagnosis not present

## 2023-04-09 DIAGNOSIS — H6993 Unspecified Eustachian tube disorder, bilateral: Secondary | ICD-10-CM | POA: Diagnosis not present

## 2023-04-09 DIAGNOSIS — H90A31 Mixed conductive and sensorineural hearing loss, unilateral, right ear with restricted hearing on the contralateral side: Secondary | ICD-10-CM | POA: Diagnosis not present

## 2023-04-09 DIAGNOSIS — H6981 Other specified disorders of Eustachian tube, right ear: Secondary | ICD-10-CM | POA: Diagnosis not present

## 2023-04-09 DIAGNOSIS — H6591 Unspecified nonsuppurative otitis media, right ear: Secondary | ICD-10-CM | POA: Diagnosis not present

## 2023-04-09 DIAGNOSIS — Z8616 Personal history of COVID-19: Secondary | ICD-10-CM | POA: Diagnosis not present

## 2023-04-11 LAB — HM PAP SMEAR
HM Pap smear: ABNORMAL
HPV, high-risk: NEGATIVE

## 2023-07-01 ENCOUNTER — Encounter: Payer: Self-pay | Admitting: Internal Medicine

## 2023-07-01 ENCOUNTER — Ambulatory Visit (INDEPENDENT_AMBULATORY_CARE_PROVIDER_SITE_OTHER): Payer: Medicare Other | Admitting: Internal Medicine

## 2023-07-01 VITALS — BP 120/80 | HR 75 | Temp 98.6°F | Ht 62.0 in | Wt 143.0 lb

## 2023-07-01 DIAGNOSIS — G4700 Insomnia, unspecified: Secondary | ICD-10-CM

## 2023-07-01 DIAGNOSIS — E785 Hyperlipidemia, unspecified: Secondary | ICD-10-CM

## 2023-07-01 DIAGNOSIS — H6591 Unspecified nonsuppurative otitis media, right ear: Secondary | ICD-10-CM

## 2023-07-01 DIAGNOSIS — N1831 Chronic kidney disease, stage 3a: Secondary | ICD-10-CM | POA: Diagnosis not present

## 2023-07-01 DIAGNOSIS — I10 Essential (primary) hypertension: Secondary | ICD-10-CM | POA: Diagnosis not present

## 2023-07-01 DIAGNOSIS — E559 Vitamin D deficiency, unspecified: Secondary | ICD-10-CM | POA: Diagnosis not present

## 2023-07-01 MED ORDER — ZOLPIDEM TARTRATE 10 MG PO TABS: ORAL_TABLET | ORAL | 1 refills | Status: DC

## 2023-07-01 MED ORDER — CEFDINIR 300 MG PO CAPS
300.0000 mg | ORAL_CAPSULE | Freq: Two times a day (BID) | ORAL | 0 refills | Status: DC
Start: 2023-07-01 — End: 2024-01-06

## 2023-07-01 MED ORDER — LOSARTAN POTASSIUM 100 MG PO TABS: 100.0000 mg | ORAL_TABLET | Freq: Every day | ORAL | 3 refills | Status: DC

## 2023-07-01 NOTE — Assessment & Plan Note (Signed)
Losartan -  100 mg/d 2024

## 2023-07-01 NOTE — Progress Notes (Signed)
Subjective:  Patient ID: Olivia Odom, female    DOB: 18-Sep-1946  Age: 77 y.o. MRN: [id number]  CC: Hypertension   HPI [name] presents for HTN, CRI, insomnia Patient is complaining of right ear discomfort of several hours duration.  She was seen by ENT.  She was diagnosed with serous otitis.  Currently treated for eustachian tube dysfunction.  Outpatient Medications Prior to Visit  Medication Sig Dispense Refill   Cholecalciferol (VITAMIN D3) 50 MCG (2000 UT) capsule Take 1 capsule (2,000 Units total) by mouth daily. 100 capsule 3   EPINEPHrine 0.3 mg/0.3 mL IJ SOAJ injection Inject into the muscle.     estradiol (ESTRACE) 0.1 MG/GM vaginal cream Apply fingertip amount night for 2 weeks, then apply every other night indefinitely     losartan (COZAAR) 100 MG tablet Take 1 tablet (100 mg total) by mouth daily. 90 tablet 3   losartan (COZAAR) 50 MG tablet Take 1 tablet (50 mg total) by mouth daily. 90 tablet 3   zolpidem (AMBIEN) 10 MG tablet TAKE 1 TABLET BY MOUTH EVERY DAY AT BEDTIME AS NEEDED 90 tablet 1   azithromycin (ZITHROMAX Z-PAK) 250 MG tablet As directed (Patient not taking: Reported on 07/01/2023) 6 tablet 0   No facility-administered medications prior to visit.    ROS: Review of Systems  Constitutional:  Negative for activity change, appetite change, chills, fatigue and unexpected weight change.  HENT:  Negative for congestion, mouth sores and sinus pressure.   Eyes:  Negative for visual disturbance.  Respiratory:  Negative for cough and chest tightness.   Gastrointestinal:  Negative for abdominal pain and nausea.  Genitourinary:  Negative for difficulty urinating, frequency and vaginal pain.  Musculoskeletal:  Negative for back pain and gait problem.  Skin:  Negative for pallor and rash.  Neurological:  Negative for dizziness, tremors, weakness, numbness and headaches.  Psychiatric/Behavioral:  Positive for sleep disturbance. Negative for confusion and  suicidal ideas. The patient is not nervous/anxious.     Objective:  BP 120/80 (BP Location: Right Arm, Patient Position: Sitting, Cuff Size: Normal)   Pulse 75   Temp 98.6 F (37 C) (Oral)   Ht 5\' 2"  (1.575 m)   Wt 143 lb (64.9 kg)   SpO2 97%   BMI 26.16 kg/m   BP Readings from Last 3 Encounters:  07/01/23 120/80  01/01/23 138/84  06/19/22 122/80    Wt Readings from Last 3 Encounters:  07/01/23 143 lb (64.9 kg)  01/01/23 143 lb (64.9 kg)  06/19/22 140 lb (63.5 kg)    Physical Exam Constitutional:      General: She is not in acute distress.    Appearance: She is well-developed.  HENT:     Head: Normocephalic.     Right Ear: External ear normal.     Left Ear: External ear normal.     Nose: Nose normal.  Eyes:     General:        Right eye: No discharge.        Left eye: No discharge.     Conjunctiva/sclera: Conjunctivae normal.     Pupils: Pupils are equal, round, and reactive to light.  Neck:     Thyroid: No thyromegaly.     Vascular: No JVD.     Trachea: No tracheal deviation.  Cardiovascular:     Rate and Rhythm: Normal rate and regular rhythm.     Heart sounds: Normal heart sounds.  Pulmonary:     Effort: No  respiratory distress.     Breath sounds: No stridor. No wheezing.  Abdominal:     General: Bowel sounds are normal. There is no distension.     Palpations: Abdomen is soft. There is no mass.     Tenderness: There is no abdominal tenderness. There is no guarding or rebound.  Musculoskeletal:        General: No tenderness.     Cervical back: Normal range of motion and neck supple. No rigidity.  Lymphadenopathy:     Cervical: No cervical adenopathy.  Skin:    Findings: No erythema or rash.  Neurological:     Cranial Nerves: No cranial nerve deficit.     Motor: No abnormal muscle tone.     Coordination: Coordination normal.     Gait: Gait normal.     Deep Tendon Reflexes: Reflexes normal.  Psychiatric:        Behavior: Behavior normal.         Thought Content: Thought content normal.        Judgment: Judgment normal.   Right tympanic membrane appears slightly dull.  No erythema  Lab Results  Component Value Date   WBC 6.6 01/01/2023   HGB 14.7 01/01/2023   HCT 43.4 01/01/2023   PLT 290.0 01/01/2023   GLUCOSE 98 01/01/2023   CHOL 258 (H) 06/21/2022   TRIG 96.0 06/21/2022   HDL 64.30 06/21/2022   LDLDIRECT 160.7 01/09/2012   LDLCALC 174 (H) 06/21/2022   ALT 20 01/01/2023   AST 19 01/01/2023   NA 138 01/01/2023   K 4.6 01/01/2023   CL 102 01/01/2023   CREATININE 0.94 01/01/2023   BUN 19 01/01/2023   CO2 29 01/01/2023   TSH 1.24 12/14/2021   HGBA1C 5.6 06/21/2022    MR Abdomen W Wo Contrast  Result Date: 01/02/2020 CLINICAL DATA:  Renal mass.  History of right renal angiomyolipoma. EXAM: MRI ABDOMEN WITHOUT AND WITH CONTRAST TECHNIQUE: Multiplanar multisequence MR imaging of the abdomen was performed both before and after the administration of intravenous contrast. CONTRAST:  14mL MULTIHANCE GADOBENATE DIMEGLUMINE 529 MG/ML IV SOLN COMPARISON:  CT 04/24/2017 FINDINGS: Lower chest: Unremarkable. Hepatobiliary: 19 mm cavernous hemangioma identified inferior right liver, stable in the nearly 3 year interval since prior study. Tiny gallstone noted without apparent gallbladder wall thickening or pericholecystic fluid no intra or extrahepatic biliary duct dilatation. Pancreas: No focal mass lesion. No dilatation of the main duct. No intraparenchymal cyst. No peripancreatic edema. Spleen:  Visualized portions of the spleen are unremarkable. Adrenals/Urinary Tract:  No adrenal nodule or mass. Exophytic 1.7 x 1.5 cm lesion is identified in the upper pole right kidney, following fat signal intensity on all pulse sequences. After IV contrast administration, no substantial enhancement is identified in the lesion. This corresponds to the fatty lesion seen on previous CT and remains consistent with angiomyolipoma right kidney unremarkable. No  suspicious enhancing lesion identified in either kidney. Stomach/Bowel: Stomach is nondistended. Duodenum is normally positioned as is the ligament of Treitz. No small bowel or colonic distension within the visualized abdomen. Vascular/Lymphatic: No abdominal aortic aneurysm. No abdominal lymphadenopathy Other:  No intraperitoneal free fluid. Musculoskeletal: No abnormal marrow enhancement within the visualized bony anatomy. IMPRESSION: 1. Minimal interval increase in size of the right upper pole renal angiomyolipoma comparing to CT scan from 04/24/2017. 2. Similar appearance of a 19 mm cavernous hemangioma identified inferior right liver. Electronically Signed   By: Kennith Center M.D.   On: 01/02/2020 11:55    Assessment &  Plan:   Problem List Items Addressed This Visit     Vitamin D deficiency    Continue with vitamin D      Dyslipidemia   Relevant Orders   TSH   Lipid panel   Insomnia    Chronic, recurrent.  Primarily related to travel Zolpidem prn  Potential benefits of a long term benzodiazepines  use as well as potential risks  and complications were explained to the patient and were aknowledged.      Relevant Orders   TSH   Urinalysis   CBC with Differential/Platelet   Lipid panel   Comprehensive metabolic panel   VITAMIN D 25 Hydroxy (Vit-D Deficiency, Fractures)   HTN (hypertension) - Primary    Losartan -  100 mg/d 2024      Relevant Medications   losartan (COZAAR) 100 MG tablet   Other Relevant Orders   Urinalysis   Comprehensive metabolic panel   Chronic kidney disease (CKD) stage G3a/A1, moderately decreased glomerular filtration rate (GFR) between 45-59 mL/min/1.73 square meter and albuminuria creatinine ratio less than 30 mg/g (HCC)    Monitoring GFR Good hydration      Relevant Orders   TSH   Urinalysis   CBC with Differential/Platelet   Lipid panel   Comprehensive metabolic panel   VITAMIN D 25 Hydroxy (Vit-D Deficiency, Fractures)   Serous otitis  media    New.Right ear.  Being treated for eustachian tube dysfunction.  Follow-up with ENT      Relevant Medications   cefdinir (OMNICEF) 300 MG capsule   Other Relevant Orders   VITAMIN D 25 Hydroxy (Vit-D Deficiency, Fractures)      Meds ordered this encounter  Medications   losartan (COZAAR) 100 MG tablet    Sig: Take 1 tablet (100 mg total) by mouth daily.    Dispense:  90 tablet    Refill:  3   zolpidem (AMBIEN) 10 MG tablet    Sig: TAKE 1 TABLET BY MOUTH EVERY DAY AT BEDTIME AS NEEDED    Dispense:  90 tablet    Refill:  1   cefdinir (OMNICEF) 300 MG capsule    Sig: Take 1 capsule (300 mg total) by mouth 2 (two) times daily.    Dispense:  20 capsule    Refill:  0      Follow-up: No follow-ups on file.  Sonda Primes, MD

## 2023-07-02 ENCOUNTER — Encounter: Payer: Self-pay | Admitting: Internal Medicine

## 2023-07-02 DIAGNOSIS — H6590 Unspecified nonsuppurative otitis media, unspecified ear: Secondary | ICD-10-CM | POA: Insufficient documentation

## 2023-07-02 NOTE — Assessment & Plan Note (Signed)
New.Right ear.  Being treated for eustachian tube dysfunction.  Follow-up with ENT

## 2023-07-02 NOTE — Assessment & Plan Note (Signed)
Monitoring GFR °Good hydration °

## 2023-07-02 NOTE — Assessment & Plan Note (Signed)
Chronic, recurrent.  Primarily related to travel Zolpidem prn  Potential benefits of a long term benzodiazepines  use as well as potential risks  and complications were explained to the patient and were aknowledged.

## 2023-07-02 NOTE — Assessment & Plan Note (Signed)
Continue with vitamin D 

## 2023-07-10 DIAGNOSIS — H6991 Unspecified Eustachian tube disorder, right ear: Secondary | ICD-10-CM | POA: Diagnosis not present

## 2023-07-10 DIAGNOSIS — H90A31 Mixed conductive and sensorineural hearing loss, unilateral, right ear with restricted hearing on the contralateral side: Secondary | ICD-10-CM | POA: Diagnosis not present

## 2023-07-10 DIAGNOSIS — T162XXA Foreign body in left ear, initial encounter: Secondary | ICD-10-CM | POA: Diagnosis not present

## 2023-08-05 DIAGNOSIS — Z23 Encounter for immunization: Secondary | ICD-10-CM | POA: Diagnosis not present

## 2023-08-14 DIAGNOSIS — H903 Sensorineural hearing loss, bilateral: Secondary | ICD-10-CM | POA: Insufficient documentation

## 2023-08-14 DIAGNOSIS — Z01118 Encounter for examination of ears and hearing with other abnormal findings: Secondary | ICD-10-CM | POA: Diagnosis not present

## 2023-08-27 ENCOUNTER — Ambulatory Visit: Payer: Medicare Other

## 2023-08-27 VITALS — Ht 62.0 in | Wt 143.0 lb

## 2023-08-27 DIAGNOSIS — Z0000 Encounter for general adult medical examination without abnormal findings: Secondary | ICD-10-CM

## 2023-08-27 NOTE — Progress Notes (Addendum)
Subjective:   Olivia Odom is a 77 y.o. female who presents for Medicare Annual (Subsequent) preventive examination.  Visit Complete: Virtual  I connected with  Olivia Odom on 08/27/23 by a audio enabled telemedicine application and verified that I am speaking with the correct person using two identifiers.  Patient Location: Home  Provider Location: Home Office  I discussed the limitations of evaluation and management by telemedicine. The patient expressed understanding and agreed to proceed.  Patient Medicare AWV questionnaire was completed by the patient on 08/23/23; I have confirmed that all information answered by patient is correct and no changes since this date.  Vital Signs: Because this visit was a virtual/telehealth visit, some criteria may be missing or patient reported. Any vitals not documented were not able to be obtained and vitals that have been documented are patient reported.    Review of Systems    Cardiac Risk Factors include: advanced age (>34men, >80 women);hypertension;dyslipidemia;Other (see comment), Risk factor comments: CKD     Objective:    Today's Vitals   08/27/23 1107  Weight: 143 lb (64.9 kg)  Height: 5\' 2"  (1.575 m)   Body mass index is 26.16 kg/m.     08/27/2023   11:19 AM 03/21/2017    9:32 AM  Advanced Directives  Does Patient Have a Medical Advance Directive? Yes No  Type of Estate agent of [place];Living will   Does patient want to make changes to medical advance directive? No - Patient declined   Copy of Healthcare Power of Attorney in Chart? Yes - validated most recent copy scanned in chart (See row information)   Would patient like information on creating a medical advance directive?  Yes (ED - Information included in AVS)    Current Medications (verified) Outpatient Encounter Medications as of 08/27/2023  Medication Sig   Cholecalciferol (VITAMIN D3) 50 MCG (2000 UT) capsule Take 1 capsule (2,000 Units  total) by mouth daily.   EPINEPHrine 0.3 mg/0.3 mL IJ SOAJ injection Inject into the muscle.   estradiol (ESTRACE) 0.1 MG/GM vaginal cream Apply fingertip amount night for 2 weeks, then apply every other night indefinitely   losartan (COZAAR) 100 MG tablet Take 1 tablet (100 mg total) by mouth daily.   zolpidem (AMBIEN) 10 MG tablet TAKE 1 TABLET BY MOUTH EVERY DAY AT BEDTIME AS NEEDED   azithromycin (ZITHROMAX Z-PAK) 250 MG tablet As directed (Patient not taking: Reported on 07/01/2023)   cefdinir (OMNICEF) 300 MG capsule Take 1 capsule (300 mg total) by mouth 2 (two) times daily.   No facility-administered encounter medications on file as of 08/27/2023.    Allergies (verified) Patient has no known allergies.   History: Past Medical History:  Diagnosis Date   Hyperlipidemia    Past Surgical History:  Procedure Laterality Date   ABDOMINAL HYSTERECTOMY     BREAST CYST EXCISION     college   OVARIAN CYST REMOVAL  college   Family History  Problem Relation Age of Onset   Breast cancer Mother 36   Heart disease Father 54       MI   Parkinson's disease Father    Social History   Socioeconomic History   Marital status: Married    Spouse name: Chrissie Noa   Number of children: 3   Years of education: Not on file   Highest education level: Not on file  Occupational History   Occupation: Retired  Tobacco Use   Smoking status: Never   Smokeless tobacco:  Never  Vaping Use   Vaping status: Never Used  Substance and Sexual Activity   Alcohol use: Yes    Alcohol/week: 1.0 standard drink of alcohol    Types: 1 Glasses of wine per week   Drug use: No   Sexual activity: Yes  Other Topics Concern   Not on file  Social History Narrative   Has a dog.   Social Determinants of Health   Financial Resource Strain: Low Risk  (08/23/2023)   Overall Financial Resource Strain (CARDIA)    Difficulty of Paying Living Expenses: Not hard at all  Food Insecurity: No Food Insecurity (08/23/2023)    Hunger Vital Sign    Worried About Running Out of Food in the Last Year: Never true    Ran Out of Food in the Last Year: Never true  Transportation Needs: No Transportation Needs (08/23/2023)   PRAPARE - Administrator, Civil Service (Medical): No    Lack of Transportation (Non-Medical): No  Physical Activity: Sufficiently Active (08/23/2023)   Exercise Vital Sign    Days of Exercise per Week: 6 days    Minutes of Exercise per Session: 60 min  Stress: No Stress Concern Present (08/23/2023)   [employer] of Occupational Health - Occupational Stress Questionnaire    Feeling of Stress : Only a little  Social Connections: Unknown (08/23/2023)   Social Connection and Isolation Panel [NHANES]    Frequency of Communication with Friends and Family: More than three times a week    Frequency of Social Gatherings with Friends and Family: Three times a week    Attends Religious Services: Not on file    Active Member of Clubs or Organizations: Yes    Attends Banker Meetings: More than 4 times per year    Marital Status: Married    Tobacco Counseling Counseling given: Not Answered   Clinical Intake:  Pre-visit preparation completed: Yes  Pain : No/denies pain     BMI - recorded: 26.16 Nutritional Risks: None Diabetes: No  How often do you need to have someone help you when you read instructions, pamphlets, or other written materials from your doctor or pharmacy?: 1 - Never  Interpreter Needed?: No  Information entered by :: Dhilan Brauer, RMA   Activities of Daily Living    08/27/2023   11:12 AM 08/23/2023    1:07 PM  In your present state of health, do you have any difficulty performing the following activities:  Hearing? 0 0  Vision? 0 0  Difficulty concentrating or making decisions? 0 0  Walking or climbing stairs? 0 0  Dressing or bathing? 0 0  Doing errands, shopping? 0 0  Preparing Food and eating ?  N  Using the Toilet?  N  In the past  six months, have you accidently leaked urine?  Y  Do you have problems with loss of bowel control?  N  Managing your Medications?  N  Managing your Finances?  N  Housekeeping or managing your Housekeeping?  N    Patient Care Team: Plotnikov, Georgina Quint, MD as PCP - General (Internal Medicine) Miguel Aschoff, MD (Inactive) (Obstetrics and Gynecology) Sharrell Ku, MD as Consulting Physician (Gastroenterology) Delmonte, Wylie Hail, MD as Consulting Physician (Ophthalmology)  Indicate any recent Medical Services you may have received from other than [facility] providers in the past year (date may be approximate).     Assessment:   This is a routine wellness examination for Olivia Odom.  Hearing/Vision screen Hearing Screening -  Comments:: Denies hearing difficulties   Vision Screening - Comments:: Wears eyeglasses for reading  Dietary issues and exercise activities discussed:     Goals Addressed             This Visit's Progress    increase daily water intake.   On track    Keep a cup of water by my side and refill the cup often.      Depression Screen    08/27/2023   11:22 AM 07/01/2023    4:33 PM 01/01/2023    2:19 PM 12/14/2021   10:11 AM 07/31/2018    7:54 AM 03/21/2017    9:35 AM 03/06/2016    3:28 PM  PHQ 2/9 Scores  PHQ - 2 Score 0 0 0 0 0 0 0  PHQ- 9 Score 1  2 2        Fall Risk    08/23/2023    1:07 PM 07/01/2023    4:33 PM 01/01/2023    2:19 PM 12/14/2021   10:11 AM 07/31/2018    7:54 AM  Fall Risk   Falls in the past year? 0 0 0 0 No  Number falls in past yr: 0 0 0 0   Injury with Fall? 0 0 0 0   Risk for fall due to :  No Fall Risks No Fall Risks No Fall Risks   Follow up Falls evaluation completed;Falls prevention discussed Falls evaluation completed Falls evaluation completed      MEDICARE RISK AT HOME: Medicare Risk at Home Any stairs in or around the home?: Yes If so, are there any without handrails?: No Home free of loose throw rugs in walkways, pet beds,  electrical cords, etc?: No Adequate lighting in your home to reduce risk of falls?: Yes Life alert?: No Use of a cane, walker or w/c?: No Grab bars in the bathroom?: No Shower chair or bench in shower?: No Elevated toilet seat or a handicapped toilet?: No  TIMED UP AND GO:  Was the test performed?  No    Cognitive Function:        08/27/2023   11:20 AM  6CIT Screen  What Year? 0 points  What month? 0 points  What time? 0 points  Count back from 20 0 points  Months in reverse 0 points  Repeat phrase 0 points  Total Score 0 points    Immunizations Immunization History  Administered Date(s) Administered   COVID-19, mRNA, vaccine(Comirnaty)12 years and older 09/13/2022   Covid-19, Mrna,Vaccine(Spikevax)68yrs and older 08/05/2023   Fluad Quad(high Dose 65+) 09/05/2021, 09/13/2022   H1N1 12/01/2008   Influenza Split 09/12/2012   Influenza Whole 09/27/2009   Influenza, High Dose Seasonal PF 09/18/2016, 09/06/2017, 08/18/2018, 08/27/2019, 09/15/2020   Influenza-Unspecified 08/24/2013, 08/24/2014, 08/25/2015, 09/23/2020   PFIZER Comirnaty(Gray Top)Covid-19 Tri-Sucrose Vaccine 02/08/2021   PFIZER(Purple Top)SARS-COV-2 Vaccination 01/27/2020, 02/17/2020   Pfizer Covid-19 Vaccine Bivalent Booster 85yrs & up 09/05/2021, 04/21/2022   Pneumococcal Conjugate-13 12/29/2014   Pneumococcal Polysaccharide-23 02/26/2014   Respiratory Syncytial Virus Vaccine,Recomb Aduvanted(Arexvy) 10/25/2022   Tdap 05/28/2011   Zoster Recombinant(Shingrix) 08/27/2019, 12/03/2019   Zoster, Live 05/28/2011    TDAP status: Due, Education has been provided regarding the importance of this vaccine. Advised may receive this vaccine at local pharmacy or Health Dept. Aware to provide a copy of the vaccination record if obtained from local pharmacy or Health Dept. Verbalized acceptance and understanding.  Flu Vaccine status: Due, Education has been provided regarding the importance of this vaccine. Advised may  receive this vaccine at local pharmacy or Health Dept. Aware to provide a copy of the vaccination record if obtained from local pharmacy or Health Dept. Verbalized acceptance and understanding.  Pneumococcal vaccine status: Up to date  Covid-19 vaccine status: Information provided on how to obtain vaccines.   Qualifies for Shingles Vaccine? Yes   Zostavax completed Yes   Shingrix Completed?: Yes  Screening Tests Health Maintenance  Topic Date Due   Hepatitis C Screening  Never done   MAMMOGRAM  06/24/2018   DTaP/Tdap/Td (2 - Td or Tdap) 05/27/2021   INFLUENZA VACCINE  07/25/2023   Medicare Annual Wellness (AWV)  08/26/2024   Colonoscopy  05/05/2030   Pneumonia Vaccine 96+ Years old  Completed   DEXA SCAN  Completed   COVID-19 Vaccine  Completed   Zoster Vaccines- Shingrix  Completed   HPV VACCINES  Aged Out    Health Maintenance  Health Maintenance Due  Topic Date Due   Hepatitis C Screening  Never done   MAMMOGRAM  06/24/2018   DTaP/Tdap/Td (2 - Td or Tdap) 05/27/2021   INFLUENZA VACCINE  07/25/2023    Colorectal cancer screening: Type of screening: Colonoscopy. Completed 05/05/2020. Repeat every 10 years  Mammogram status: Completed 2024. Repeat every year  Bone Density status: Ordered 08/27/2023. Pt provided with contact info and advised to call to schedule appt.  Lung Cancer Screening: (Low Dose CT Chest recommended if Age 43-80 years, 20 pack-year currently smoking OR have quit w/in 15years.) does not qualify.   Lung Cancer Screening Referral: N/A  Additional Screening:  Hepatitis C Screening: does qualify;   Vision Screening: Recommended annual ophthalmology exams for early detection of glaucoma and other disorders of the eye. Is the patient up to date with their annual eye exam?  Yes  Who is the provider or what is the name of the office in which the patient attends annual eye exams? Dr. Georga Hacking If pt is not established with a provider, would they like to be  referred to a provider to establish care? No .   Dental Screening: Recommended annual dental exams for proper oral hygiene   Community Resource Referral / Chronic Care Management: CRR required this visit?  No   CCM required this visit?  No     Plan:     I have personally reviewed and noted the following in the patient's chart:   Medical and social history Use of alcohol, tobacco or illicit drugs  Current medications and supplements including opioid prescriptions. Patient is not currently taking opioid prescriptions. Functional ability and status Nutritional status Physical activity Advanced directives List of other physicians Hospitalizations, surgeries, and ER visits in previous 12 months Vitals Screenings to include cognitive, depression, and falls Referrals and appointments  In addition, I have reviewed and discussed with patient certain preventive protocols, quality metrics, and best practice recommendations. A written personalized care plan for preventive services as well as general preventive health recommendations were provided to patient.     Ronin Rehfeldt L Amiee Wiley, CMA   08/27/2023   After Visit Summary: (MyChart) Due to this being a telephonic visit, the after visit summary with patients personalized plan was offered to patient via MyChart   Nurse Notes: Patient is due for for her mammogram for this year, which she has scheduled appointment in the next week or so.  She is also due for a Tdap and a Hep C screening.  Patient would like to get the labs for Hep C done before her next wellness  visit.    Medical screening examination/treatment/procedure(s) were performed by non-physician practitioner and as supervising physician I was immediately available for consultation/collaboration.  I agree with above. Jacinta Shoe, MD

## 2023-09-23 DIAGNOSIS — Z1231 Encounter for screening mammogram for malignant neoplasm of breast: Secondary | ICD-10-CM | POA: Diagnosis not present

## 2023-09-27 DIAGNOSIS — Z23 Encounter for immunization: Secondary | ICD-10-CM | POA: Diagnosis not present

## 2023-10-31 DIAGNOSIS — N393 Stress incontinence (female) (male): Secondary | ICD-10-CM | POA: Diagnosis not present

## 2024-01-06 ENCOUNTER — Ambulatory Visit (INDEPENDENT_AMBULATORY_CARE_PROVIDER_SITE_OTHER): Payer: Medicare Other | Admitting: Internal Medicine

## 2024-01-06 ENCOUNTER — Encounter: Payer: Self-pay | Admitting: Internal Medicine

## 2024-01-06 VITALS — BP 110/70 | HR 74 | Temp 97.8°F | Ht 62.0 in | Wt 145.0 lb

## 2024-01-06 DIAGNOSIS — I10 Essential (primary) hypertension: Secondary | ICD-10-CM

## 2024-01-06 DIAGNOSIS — M6530 Trigger finger, unspecified finger: Secondary | ICD-10-CM | POA: Insufficient documentation

## 2024-01-06 DIAGNOSIS — M65341 Trigger finger, right ring finger: Secondary | ICD-10-CM | POA: Diagnosis not present

## 2024-01-06 DIAGNOSIS — E785 Hyperlipidemia, unspecified: Secondary | ICD-10-CM | POA: Diagnosis not present

## 2024-01-06 DIAGNOSIS — E559 Vitamin D deficiency, unspecified: Secondary | ICD-10-CM

## 2024-01-06 MED ORDER — LOSARTAN POTASSIUM 100 MG PO TABS: 100.0000 mg | ORAL_TABLET | Freq: Every day | ORAL | 3 refills | Status: DC

## 2024-01-06 MED ORDER — EPINEPHRINE 0.3 MG/0.3ML IJ SOAJ: 0.3000 mg | INTRAMUSCULAR | 3 refills | Status: AC | PRN

## 2024-01-06 MED ORDER — ZOLPIDEM TARTRATE 10 MG PO TABS: ORAL_TABLET | ORAL | 1 refills | Status: DC

## 2024-01-06 NOTE — Assessment & Plan Note (Signed)
Losartan -  100 mg/d 2024

## 2024-01-06 NOTE — Assessment & Plan Note (Signed)
Coronary calcium score of 0. 

## 2024-01-06 NOTE — Progress Notes (Signed)
 Subjective:  Patient ID: Olivia Odom, female    DOB: [birth year]  Age: 78 y.o. MRN: [id number]  CC: Annual Exam   HPI Olivia Odom presents for HTN, insomnia, CRI C/o R trigger finger  Outpatient Medications Prior to Visit  Medication Sig Dispense Refill   Cholecalciferol (VITAMIN D3) 50 MCG (2000 UT) capsule Take 1 capsule (2,000 Units total) by mouth daily. 100 capsule 3   estradiol (ESTRACE) 0.1 MG/GM vaginal cream Apply fingertip amount night for 2 weeks, then apply every other night indefinitely     EPINEPHrine  0.3 mg/0.3 mL IJ SOAJ injection Inject into the muscle.     losartan  (COZAAR ) 100 MG tablet Take 1 tablet (100 mg total) by mouth daily. 90 tablet 3   zolpidem  (AMBIEN ) 10 MG tablet TAKE 1 TABLET BY MOUTH EVERY DAY AT BEDTIME AS NEEDED 90 tablet 1   azithromycin  (ZITHROMAX  Z-PAK) 250 MG tablet As directed (Patient not taking: Reported on 07/01/2023) 6 tablet 0   cefdinir  (OMNICEF ) 300 MG capsule Take 1 capsule (300 mg total) by mouth 2 (two) times daily. 20 capsule 0   No facility-administered medications prior to visit.    ROS: Review of Systems  Constitutional:  Negative for activity change, appetite change, chills, fatigue and unexpected weight change.  HENT:  Negative for congestion, ear discharge, mouth sores and sinus pressure.   Eyes:  Negative for visual disturbance.  Respiratory:  Negative for cough and chest tightness.   Gastrointestinal:  Negative for abdominal pain and nausea.  Genitourinary:  Negative for difficulty urinating, frequency and vaginal pain.  Musculoskeletal:  Negative for back pain and gait problem.  Skin:  Negative for pallor and rash.  Neurological:  Negative for dizziness, tremors, weakness, numbness and headaches.  Hematological:  Positive for adenopathy.  Psychiatric/Behavioral:  Negative for confusion, sleep disturbance and suicidal ideas.     Objective:  BP 110/70 (BP Location: Left Arm, Patient Position: Sitting, Cuff Size:  Normal)   Pulse 74   Temp 97.8 F (36.6 C) (Oral)   Ht 5' 2 (1.575 m)   Wt 145 lb (65.8 kg)   SpO2 97%   BMI 26.52 kg/m   BP Readings from Last 3 Encounters:  01/06/24 110/70  07/01/23 120/80  01/01/23 138/84    Wt Readings from Last 3 Encounters:  01/06/24 145 lb (65.8 kg)  08/27/23 143 lb (64.9 kg)  07/01/23 143 lb (64.9 kg)    Physical Exam Constitutional:      General: She is not in acute distress.    Appearance: Normal appearance. She is well-developed.  HENT:     Head: Normocephalic.     Right Ear: External ear normal.     Left Ear: External ear normal.     Nose: Nose normal.  Eyes:     General:        Right eye: No discharge.        Left eye: No discharge.     Conjunctiva/sclera: Conjunctivae normal.     Pupils: Pupils are equal, round, and reactive to light.  Neck:     Thyroid : No thyromegaly.     Vascular: No JVD.     Trachea: No tracheal deviation.  Cardiovascular:     Rate and Rhythm: Normal rate and regular rhythm.     Heart sounds: Normal heart sounds.  Pulmonary:     Effort: No respiratory distress.     Breath sounds: No stridor. No wheezing.  Abdominal:     General: Bowel  sounds are normal. There is no distension.     Palpations: Abdomen is soft. There is no mass.     Tenderness: There is no abdominal tenderness. There is no guarding or rebound.  Musculoskeletal:        General: No tenderness.     Cervical back: Normal range of motion and neck supple. No rigidity.  Lymphadenopathy:     Cervical: No cervical adenopathy.  Skin:    Findings: No erythema or rash.  Neurological:     Cranial Nerves: No cranial nerve deficit.     Motor: No abnormal muscle tone.     Coordination: Coordination normal.     Deep Tendon Reflexes: Reflexes normal.  Psychiatric:        Behavior: Behavior normal.        Thought Content: Thought content normal.        Judgment: Judgment normal.    R 4th trigger finger   Lab Results  Component Value Date   WBC  6.6 01/01/2023   HGB 14.7 01/01/2023   HCT 43.4 01/01/2023   PLT 290.0 01/01/2023   GLUCOSE 98 01/01/2023   CHOL 258 (H) 06/21/2022   TRIG 96.0 06/21/2022   HDL 64.30 06/21/2022   LDLDIRECT 160.7 01/09/2012   LDLCALC 174 (H) 06/21/2022   ALT 20 01/01/2023   AST 19 01/01/2023   NA 138 01/01/2023   K 4.6 01/01/2023   CL 102 01/01/2023   CREATININE 0.94 01/01/2023   BUN 19 01/01/2023   CO2 29 01/01/2023   TSH 1.24 12/14/2021   HGBA1C 5.6 06/21/2022    MR Abdomen W Wo Contrast Result Date: 01/02/2020 CLINICAL DATA:  Renal mass.  History of right renal angiomyolipoma. EXAM: MRI ABDOMEN WITHOUT AND WITH CONTRAST TECHNIQUE: Multiplanar multisequence MR imaging of the abdomen was performed both before and after the administration of intravenous contrast. CONTRAST:  14mL MULTIHANCE  GADOBENATE DIMEGLUMINE  529 MG/ML IV SOLN COMPARISON:  CT 04/24/2017 FINDINGS: Lower chest: Unremarkable. Hepatobiliary: 19 mm cavernous hemangioma identified inferior right liver, stable in the nearly 3 year interval since prior study. Tiny gallstone noted without apparent gallbladder wall thickening or pericholecystic fluid no intra or extrahepatic biliary duct dilatation. Pancreas: No focal mass lesion. No dilatation of the main duct. No intraparenchymal cyst. No peripancreatic edema. Spleen:  Visualized portions of the spleen are unremarkable. Adrenals/Urinary Tract:  No adrenal nodule or mass. Exophytic 1.7 x 1.5 cm lesion is identified in the upper pole right kidney, following fat signal intensity on all pulse sequences. After IV contrast administration, no substantial enhancement is identified in the lesion. This corresponds to the fatty lesion seen on previous CT and remains consistent with angiomyolipoma right kidney unremarkable. No suspicious enhancing lesion identified in either kidney. Stomach/Bowel: Stomach is nondistended. Duodenum is normally positioned as is the ligament of Treitz. No small bowel or colonic  distension within the visualized abdomen. Vascular/Lymphatic: No abdominal aortic aneurysm. No abdominal lymphadenopathy Other:  No intraperitoneal free fluid. Musculoskeletal: No abnormal marrow enhancement within the visualized bony anatomy. IMPRESSION: 1. Minimal interval increase in size of the right upper pole renal angiomyolipoma comparing to CT scan from 04/24/2017. 2. Similar appearance of a 19 mm cavernous hemangioma identified inferior right liver. Electronically Signed   By: Camellia Candle M.D.   On: 01/02/2020 11:55    Assessment & Plan:   Problem List Items Addressed This Visit     Vitamin D  deficiency - Primary   Continue with vitamin D       Dyslipidemia  Coronary calcium score of 0       HTN (hypertension)   Losartan  -  100 mg/d 2024      Relevant Medications   losartan  (COZAAR ) 100 MG tablet   EPINEPHrine  0.3 mg/0.3 mL IJ SOAJ injection   Trigger finger of right hand   R 4th trigger finger Discussed         Meds ordered this encounter  Medications   zolpidem  (AMBIEN ) 10 MG tablet    Sig: TAKE 1 TABLET BY MOUTH EVERY DAY AT BEDTIME AS NEEDED    Dispense:  90 tablet    Refill:  1   losartan  (COZAAR ) 100 MG tablet    Sig: Take 1 tablet (100 mg total) by mouth daily.    Dispense:  90 tablet    Refill:  3   EPINEPHrine  0.3 mg/0.3 mL IJ SOAJ injection    Sig: Inject 0.3 mg into the skin as needed for anaphylaxis.    Dispense:  1 each    Refill:  3      Follow-up: Return in about 3 months (around 04/05/2024) for a follow-up visit.  Marolyn Noel, MD

## 2024-01-06 NOTE — Assessment & Plan Note (Addendum)
 R 4th trigger finger Discussed

## 2024-01-06 NOTE — Assessment & Plan Note (Signed)
 Continue with vitamin D

## 2024-01-07 ENCOUNTER — Other Ambulatory Visit (INDEPENDENT_AMBULATORY_CARE_PROVIDER_SITE_OTHER): Payer: Medicare Other

## 2024-01-07 DIAGNOSIS — N1831 Chronic kidney disease, stage 3a: Secondary | ICD-10-CM | POA: Diagnosis not present

## 2024-01-07 DIAGNOSIS — H6591 Unspecified nonsuppurative otitis media, right ear: Secondary | ICD-10-CM

## 2024-01-07 DIAGNOSIS — I10 Essential (primary) hypertension: Secondary | ICD-10-CM | POA: Diagnosis not present

## 2024-01-07 DIAGNOSIS — G4700 Insomnia, unspecified: Secondary | ICD-10-CM | POA: Diagnosis not present

## 2024-01-07 DIAGNOSIS — E785 Hyperlipidemia, unspecified: Secondary | ICD-10-CM

## 2024-01-07 LAB — URINALYSIS
Bilirubin Urine: NEGATIVE
Hgb urine dipstick: NEGATIVE
Ketones, ur: NEGATIVE
Leukocytes,Ua: NEGATIVE
Nitrite: NEGATIVE
Specific Gravity, Urine: 1.01 (ref 1.000–1.030)
Total Protein, Urine: NEGATIVE
Urine Glucose: NEGATIVE
Urobilinogen, UA: 0.2 (ref 0.0–1.0)
pH: 6.5 (ref 5.0–8.0)

## 2024-01-07 LAB — COMPREHENSIVE METABOLIC PANEL
ALT: 19 U/L (ref 0–35)
AST: 22 U/L (ref 0–37)
Albumin: 4.3 g/dL (ref 3.5–5.2)
Alkaline Phosphatase: 78 U/L (ref 39–117)
BUN: 13 mg/dL (ref 6–23)
CO2: 29 meq/L (ref 19–32)
Calcium: 9.3 mg/dL (ref 8.4–10.5)
Chloride: 102 meq/L (ref 96–112)
Creatinine, Ser: 0.98 mg/dL (ref 0.40–1.20)
GFR: 56.47 mL/min — ABNORMAL LOW (ref >60.00)
Glucose, Bld: 106 mg/dL — ABNORMAL HIGH (ref 70–99)
Potassium: 4.6 meq/L (ref 3.5–5.1)
Sodium: 138 meq/L (ref 135–145)
Total Bilirubin: 0.5 mg/dL (ref 0.2–1.2)
Total Protein: 7 g/dL (ref 6.0–8.3)

## 2024-01-07 LAB — CBC WITH DIFFERENTIAL/PLATELET
Basophils Absolute: 0 10*3/uL (ref 0.0–0.1)
Basophils Relative: 0.8 % (ref 0.0–3.0)
Eosinophils Absolute: 0.1 10*3/uL (ref 0.0–0.7)
Eosinophils Relative: 1.2 % (ref 0.0–5.0)
HCT: 43 % (ref 36.0–46.0)
Hemoglobin: 14.7 g/dL (ref 12.0–15.0)
Lymphocytes Relative: 23.2 % (ref 12.0–46.0)
Lymphs Abs: 1.1 10*3/uL (ref 0.7–4.0)
MCHC: 34.1 g/dL (ref 30.0–36.0)
MCV: 97.4 fL (ref 78.0–100.0)
Monocytes Absolute: 0.5 10*3/uL (ref 0.1–1.0)
Monocytes Relative: 10.8 % (ref 3.0–12.0)
Neutro Abs: 3 10*3/uL (ref 1.4–7.7)
Neutrophils Relative %: 64 % (ref 43.0–77.0)
Platelets: 223 10*3/uL (ref 150.0–400.0)
RBC: 4.42 Mil/uL (ref 3.87–5.11)
RDW: 12.8 % (ref 11.5–15.5)
WBC: 4.7 10*3/uL (ref 4.0–10.5)

## 2024-01-07 LAB — LIPID PANEL
Cholesterol: 257 mg/dL — ABNORMAL HIGH (ref 0–200)
HDL: 75.2 mg/dL (ref >39.00)
LDL Cholesterol: 166 mg/dL — ABNORMAL HIGH (ref 0–99)
NonHDL: 181.46
Total CHOL/HDL Ratio: 3
Triglycerides: 79 mg/dL (ref 0.0–149.0)
VLDL: 15.8 mg/dL (ref 0.0–40.0)

## 2024-01-07 LAB — VITAMIN D 25 HYDROXY (VIT D DEFICIENCY, FRACTURES): VITD: 19.34 ng/mL — ABNORMAL LOW (ref 30.00–100.00)

## 2024-01-07 LAB — TSH: TSH: 1.84 u[IU]/mL (ref 0.35–5.50)

## 2024-01-09 ENCOUNTER — Other Ambulatory Visit: Payer: Self-pay | Admitting: Internal Medicine

## 2024-01-09 MED ORDER — VITAMIN D (ERGOCALCIFEROL) 1.25 MG (50000 UNIT) PO CAPS: 50000.0000 [IU] | ORAL_CAPSULE | ORAL | 0 refills | Status: DC

## 2024-01-29 DIAGNOSIS — L570 Actinic keratosis: Secondary | ICD-10-CM | POA: Diagnosis not present

## 2024-01-29 DIAGNOSIS — L720 Epidermal cyst: Secondary | ICD-10-CM | POA: Diagnosis not present

## 2024-03-21 DIAGNOSIS — H5789 Other specified disorders of eye and adnexa: Secondary | ICD-10-CM | POA: Diagnosis not present

## 2024-03-21 DIAGNOSIS — X58XXXA Exposure to other specified factors, initial encounter: Secondary | ICD-10-CM | POA: Diagnosis not present

## 2024-03-21 DIAGNOSIS — S0502XA Injury of conjunctiva and corneal abrasion without foreign body, left eye, initial encounter: Secondary | ICD-10-CM | POA: Diagnosis not present

## 2024-03-26 DIAGNOSIS — N393 Stress incontinence (female) (male): Secondary | ICD-10-CM | POA: Diagnosis not present

## 2024-04-13 DIAGNOSIS — Z79899 Other long term (current) drug therapy: Secondary | ICD-10-CM | POA: Diagnosis not present

## 2024-04-13 DIAGNOSIS — I10 Essential (primary) hypertension: Secondary | ICD-10-CM | POA: Diagnosis not present

## 2024-04-13 DIAGNOSIS — N393 Stress incontinence (female) (male): Secondary | ICD-10-CM | POA: Diagnosis not present

## 2024-05-28 DIAGNOSIS — N393 Stress incontinence (female) (male): Secondary | ICD-10-CM | POA: Diagnosis not present

## 2024-06-15 DIAGNOSIS — L509 Urticaria, unspecified: Secondary | ICD-10-CM | POA: Diagnosis not present

## 2024-06-15 DIAGNOSIS — S80261A Insect bite (nonvenomous), right knee, initial encounter: Secondary | ICD-10-CM | POA: Diagnosis not present

## 2024-06-15 DIAGNOSIS — W57XXXA Bitten or stung by nonvenomous insect and other nonvenomous arthropods, initial encounter: Secondary | ICD-10-CM | POA: Diagnosis not present

## 2024-06-23 ENCOUNTER — Encounter: Payer: Self-pay | Admitting: Internal Medicine

## 2024-06-23 ENCOUNTER — Ambulatory Visit (INDEPENDENT_AMBULATORY_CARE_PROVIDER_SITE_OTHER): Payer: PRIVATE HEALTH INSURANCE | Admitting: Internal Medicine

## 2024-06-23 VITALS — BP 118/78 | HR 69 | Temp 97.9°F | Ht 62.0 in | Wt 141.0 lb

## 2024-06-23 DIAGNOSIS — Z91038 Other insect allergy status: Secondary | ICD-10-CM | POA: Diagnosis not present

## 2024-06-23 DIAGNOSIS — I10 Essential (primary) hypertension: Secondary | ICD-10-CM

## 2024-06-23 DIAGNOSIS — N1831 Chronic kidney disease, stage 3a: Secondary | ICD-10-CM

## 2024-06-23 DIAGNOSIS — E559 Vitamin D deficiency, unspecified: Secondary | ICD-10-CM

## 2024-06-23 MED ORDER — LOSARTAN POTASSIUM 100 MG PO TABS: 100.0000 mg | ORAL_TABLET | Freq: Every day | ORAL | 3 refills | Status: DC

## 2024-06-23 MED ORDER — LOSARTAN POTASSIUM 100 MG PO TABS: 100.0000 mg | ORAL_TABLET | Freq: Every day | ORAL | 3 refills | Status: AC

## 2024-06-23 MED ORDER — ZOLPIDEM TARTRATE 10 MG PO TABS
ORAL_TABLET | ORAL | 1 refills | Status: AC
Start: 2024-06-23 — End: ?

## 2024-06-23 MED ORDER — METHYLPREDNISOLONE 4 MG PO TBPK: ORAL_TABLET | ORAL | 1 refills | Status: AC

## 2024-06-23 NOTE — Assessment & Plan Note (Signed)
 Continue with vitamin D

## 2024-06-23 NOTE — Progress Notes (Signed)
 Subjective:  Patient ID: Olivia Odom, female    DOB: 1946-07-25  Age: 78 y.o. MRN: [id number]  CC: Medical Management of Chronic Issues (6 mnth f/u)   HPI Olivia Odom presents for hives due to an insect bite:  F/u on CRI, HTN  Outpatient Medications Prior to Visit  Medication Sig Dispense Refill   Cholecalciferol (VITAMIN D3) 50 MCG (2000 UT) capsule Take 1 capsule (2,000 Units total) by mouth daily. 100 capsule 3   EPINEPHrine  0.3 mg/0.3 mL IJ SOAJ injection Inject 0.3 mg into the skin as needed for anaphylaxis. 1 each 3   estradiol (ESTRACE) 0.1 MG/GM vaginal cream Apply fingertip amount night for 2 weeks, then apply every other night indefinitely     ibuprofen (ADVIL) 600 MG tablet Take 600 mg by mouth every 6 (six) hours.     metoCLOPramide (REGLAN) 10 MG tablet Take 10 mg by mouth every 6 (six) hours as needed.     Vitamin D , Ergocalciferol , (DRISDOL ) 1.25 MG (50000 UNIT) CAPS capsule Take 1 capsule (50,000 Units total) by mouth every 7 (seven) days. 8 capsule 0   losartan  (COZAAR ) 100 MG tablet Take 1 tablet (100 mg total) by mouth daily. 90 tablet 3   traMADol (ULTRAM) 50 MG tablet Take 50 mg by mouth.     zolpidem  (AMBIEN ) 10 MG tablet TAKE 1 TABLET BY MOUTH EVERY DAY AT BEDTIME AS NEEDED 90 tablet 1   No facility-administered medications prior to visit.    ROS: Review of Systems  Constitutional:  Negative for activity change, appetite change, chills, fatigue and unexpected weight change.  HENT:  Negative for congestion, mouth sores and sinus pressure.   Eyes:  Negative for visual disturbance.  Respiratory:  Negative for cough and chest tightness.   Gastrointestinal:  Negative for abdominal pain and nausea.  Genitourinary:  Negative for difficulty urinating, frequency and vaginal pain.  Musculoskeletal:  Negative for back pain and gait problem.  Skin:  Negative for pallor and rash.  Neurological:  Negative for dizziness, tremors, weakness, numbness and headaches.   Psychiatric/Behavioral:  Positive for sleep disturbance. Negative for confusion and suicidal ideas.     Objective:  BP 118/78   Pulse 69   Temp 97.9 F (36.6 C) (Oral)   Ht 5' 2 (1.575 m)   Wt 141 lb (64 kg)   SpO2 97%   BMI 25.79 kg/m   BP Readings from Last 3 Encounters:  06/23/24 118/78  01/06/24 110/70  07/01/23 120/80    Wt Readings from Last 3 Encounters:  06/23/24 141 lb (64 kg)  01/06/24 145 lb (65.8 kg)  08/27/23 143 lb (64.9 kg)    Physical Exam Constitutional:      General: She is not in acute distress.    Appearance: Normal appearance. She is well-developed.  HENT:     Head: Normocephalic.     Right Ear: External ear normal.     Left Ear: External ear normal.     Nose: Nose normal.   Eyes:     General:        Right eye: No discharge.        Left eye: No discharge.     Conjunctiva/sclera: Conjunctivae normal.     Pupils: Pupils are equal, round, and reactive to light.   Neck:     Thyroid : No thyromegaly.     Vascular: No JVD.     Trachea: No tracheal deviation.   Cardiovascular:     Rate and Rhythm:  Normal rate and regular rhythm.     Heart sounds: Normal heart sounds.  Pulmonary:     Effort: No respiratory distress.     Breath sounds: No stridor. No wheezing.  Abdominal:     General: Bowel sounds are normal. There is no distension.     Palpations: Abdomen is soft. There is no mass.     Tenderness: There is no abdominal tenderness. There is no guarding or rebound.   Musculoskeletal:        General: No tenderness.     Cervical back: Normal range of motion and neck supple. No rigidity.     Right lower leg: No edema.     Left lower leg: No edema.  Lymphadenopathy:     Cervical: No cervical adenopathy.   Skin:    Findings: No erythema or rash.   Neurological:     Cranial Nerves: No cranial nerve deficit.     Motor: No abnormal muscle tone.     Coordination: Coordination normal.     Deep Tendon Reflexes: Reflexes normal.    Psychiatric:        Behavior: Behavior normal.        Thought Content: Thought content normal.        Judgment: Judgment normal.     Lab Results  Component Value Date   WBC 4.7 01/07/2024   HGB 14.7 01/07/2024   HCT 43.0 01/07/2024   PLT 223.0 01/07/2024   GLUCOSE 106 (H) 01/07/2024   CHOL 257 (H) 01/07/2024   TRIG 79.0 01/07/2024   HDL 75.20 01/07/2024   LDLDIRECT 160.7 01/09/2012   LDLCALC 166 (H) 01/07/2024   ALT 19 01/07/2024   AST 22 01/07/2024   NA 138 01/07/2024   K 4.6 01/07/2024   CL 102 01/07/2024   CREATININE 0.98 01/07/2024   BUN 13 01/07/2024   CO2 29 01/07/2024   TSH 1.84 01/07/2024   HGBA1C 5.6 06/21/2022    MR Abdomen W Wo Contrast Result Date: 01/02/2020 CLINICAL DATA:  Renal mass.  History of right renal angiomyolipoma. EXAM: MRI ABDOMEN WITHOUT AND WITH CONTRAST TECHNIQUE: Multiplanar multisequence MR imaging of the abdomen was performed both before and after the administration of intravenous contrast. CONTRAST:  14mL MULTIHANCE  GADOBENATE DIMEGLUMINE  529 MG/ML IV SOLN COMPARISON:  CT 04/24/2017 FINDINGS: Lower chest: Unremarkable. Hepatobiliary: 19 mm cavernous hemangioma identified inferior right liver, stable in the nearly 3 year interval since prior study. Tiny gallstone noted without apparent gallbladder wall thickening or pericholecystic fluid no intra or extrahepatic biliary duct dilatation. Pancreas: No focal mass lesion. No dilatation of the main duct. No intraparenchymal cyst. No peripancreatic edema. Spleen:  Visualized portions of the spleen are unremarkable. Adrenals/Urinary Tract:  No adrenal nodule or mass. Exophytic 1.7 x 1.5 cm lesion is identified in the upper pole right kidney, following fat signal intensity on all pulse sequences. After IV contrast administration, no substantial enhancement is identified in the lesion. This corresponds to the fatty lesion seen on previous CT and remains consistent with angiomyolipoma right kidney unremarkable.  No suspicious enhancing lesion identified in either kidney. Stomach/Bowel: Stomach is nondistended. Duodenum is normally positioned as is the ligament of Treitz. No small bowel or colonic distension within the visualized abdomen. Vascular/Lymphatic: No abdominal aortic aneurysm. No abdominal lymphadenopathy Other:  No intraperitoneal free fluid. Musculoskeletal: No abnormal marrow enhancement within the visualized bony anatomy. IMPRESSION: 1. Minimal interval increase in size of the right upper pole renal angiomyolipoma comparing to CT scan from 04/24/2017. 2. Similar appearance  of a 19 mm cavernous hemangioma identified inferior right liver. Electronically Signed   By: Camellia Candle M.D.   On: 01/02/2020 11:55    Assessment & Plan:   Problem List Items Addressed This Visit     HTN (hypertension)   Losartan  -  100 mg/d 2024      Relevant Medications   losartan  (COZAAR ) 100 MG tablet   Other Relevant Orders   Comprehensive metabolic panel with GFR   Allergic to insect stings - Primary   Medrol  pack - use prn Benadryl prn         Meds ordered this encounter  Medications   DISCONTD: losartan  (COZAAR ) 100 MG tablet    Sig: Take 1 tablet (100 mg total) by mouth daily.    Dispense:  90 tablet    Refill:  3   methylPREDNISolone  (MEDROL  DOSEPAK) 4 MG TBPK tablet    Sig: As directed for insect bite reaction    Dispense:  21 tablet    Refill:  1   zolpidem  (AMBIEN ) 10 MG tablet    Sig: TAKE 1 TABLET BY MOUTH EVERY DAY AT BEDTIME AS NEEDED    Dispense:  90 tablet    Refill:  1   losartan  (COZAAR ) 100 MG tablet    Sig: Take 1 tablet (100 mg total) by mouth daily.    Dispense:  90 tablet    Refill:  3      Follow-up: Return in about 6 months (around 12/24/2024) for Wellness Exam.  Marolyn Noel, MD

## 2024-06-23 NOTE — Assessment & Plan Note (Signed)
Losartan -  100 mg/d 2024

## 2024-06-23 NOTE — Assessment & Plan Note (Signed)
Monitoring GFR °Good hydration °

## 2024-06-23 NOTE — Assessment & Plan Note (Addendum)
 Medrol  pack - use prn Epipen , Benadryl, Sudafed prn

## 2024-06-29 ENCOUNTER — Other Ambulatory Visit (INDEPENDENT_AMBULATORY_CARE_PROVIDER_SITE_OTHER)

## 2024-06-29 DIAGNOSIS — I10 Essential (primary) hypertension: Secondary | ICD-10-CM | POA: Diagnosis not present

## 2024-06-29 LAB — COMPREHENSIVE METABOLIC PANEL WITH GFR
ALT: 20 U/L (ref 0–35)
AST: 20 U/L (ref 0–37)
Albumin: 4.2 g/dL (ref 3.5–5.2)
Alkaline Phosphatase: 66 U/L (ref 39–117)
BUN: 11 mg/dL (ref 6–23)
CO2: 28 meq/L (ref 19–32)
Calcium: 9.2 mg/dL (ref 8.4–10.5)
Chloride: 100 meq/L (ref 96–112)
Creatinine, Ser: 0.94 mg/dL (ref 0.40–1.20)
GFR: 59.17 mL/min — ABNORMAL LOW (ref >60.00)
Glucose, Bld: 107 mg/dL — ABNORMAL HIGH (ref 70–99)
Potassium: 4.3 meq/L (ref 3.5–5.1)
Sodium: 136 meq/L (ref 135–145)
Total Bilirubin: 0.5 mg/dL (ref 0.2–1.2)
Total Protein: 6.9 g/dL (ref 6.0–8.3)

## 2024-06-30 ENCOUNTER — Ambulatory Visit: Payer: Self-pay | Admitting: Internal Medicine

## 2024-07-23 DIAGNOSIS — L720 Epidermal cyst: Secondary | ICD-10-CM | POA: Diagnosis not present

## 2024-08-07 DIAGNOSIS — Z23 Encounter for immunization: Secondary | ICD-10-CM | POA: Diagnosis not present

## 2024-09-29 DIAGNOSIS — Z23 Encounter for immunization: Secondary | ICD-10-CM | POA: Diagnosis not present

## 2024-10-02 DIAGNOSIS — Z1231 Encounter for screening mammogram for malignant neoplasm of breast: Secondary | ICD-10-CM | POA: Diagnosis not present

## 2024-10-02 LAB — HM MAMMOGRAPHY

## 2024-10-07 ENCOUNTER — Other Ambulatory Visit: Payer: Self-pay | Admitting: Obstetrics and Gynecology

## 2024-10-07 DIAGNOSIS — R928 Other abnormal and inconclusive findings on diagnostic imaging of breast: Secondary | ICD-10-CM

## 2024-10-16 ENCOUNTER — Ambulatory Visit
Admission: RE | Admit: 2024-10-16 | Discharge: 2024-10-16 | Disposition: A | Discharge status: Home / Self Care | Payer: PRIVATE HEALTH INSURANCE | Source: Ambulatory Visit | Attending: Obstetrics and Gynecology | Admitting: Obstetrics and Gynecology

## 2024-10-16 ENCOUNTER — Other Ambulatory Visit: Payer: Self-pay | Admitting: Obstetrics and Gynecology

## 2024-10-16 DIAGNOSIS — R928 Other abnormal and inconclusive findings on diagnostic imaging of breast: Secondary | ICD-10-CM

## 2024-10-16 DIAGNOSIS — N6489 Other specified disorders of breast: Secondary | ICD-10-CM | POA: Diagnosis not present

## 2024-10-21 ENCOUNTER — Ambulatory Visit
Admission: RE | Admit: 2024-10-21 | Discharge: 2024-10-21 | Disposition: A | Source: Ambulatory Visit | Attending: Obstetrics and Gynecology | Admitting: Obstetrics and Gynecology

## 2024-10-21 ENCOUNTER — Ambulatory Visit
Admission: RE | Admit: 2024-10-21 | Discharge: 2024-10-21 | Disposition: A | Discharge status: Home / Self Care | Source: Ambulatory Visit | Attending: Obstetrics and Gynecology | Admitting: Obstetrics and Gynecology

## 2024-10-21 DIAGNOSIS — R928 Other abnormal and inconclusive findings on diagnostic imaging of breast: Secondary | ICD-10-CM

## 2024-10-21 HISTORY — PX: BREAST BIOPSY: SHX20

## 2024-10-22 LAB — SURGICAL PATHOLOGY

## 2024-10-30 ENCOUNTER — Ambulatory Visit (INDEPENDENT_AMBULATORY_CARE_PROVIDER_SITE_OTHER)

## 2024-10-30 VITALS — BP 120/72 | HR 77 | Ht 62.5 in | Wt 139.6 lb

## 2024-10-30 DIAGNOSIS — Z0000 Encounter for general adult medical examination without abnormal findings: Secondary | ICD-10-CM

## 2024-10-30 NOTE — Progress Notes (Signed)
 Subjective:   Olivia Odom is a 78 y.o. female who presents for a Medicare Annual Wellness Visit.  Allergies (verified) Insect extract   History: Past Medical History:  Diagnosis Date   Hyperlipidemia    Past Surgical History:  Procedure Laterality Date   ABDOMINAL HYSTERECTOMY     BREAST BIOPSY Left 10/21/2024   MM LT BREAST BX W LOC DEV 1ST LESION IMAGE BX SPEC STEREO GUIDE 10/21/2024 [facility]   BREAST CYST EXCISION     college   INCONTINENCE SURGERY     OVARIAN CYST REMOVAL  college   Family History  Problem Relation Age of Onset   Breast cancer Mother 78   Heart disease Father 39       MI   Parkinson's disease Father    Social History   Occupational History   Occupation: Retired  Tobacco Use   Smoking status: Never   Smokeless tobacco: Never  Vaping Use   Vaping status: Never Used  Substance and Sexual Activity   Alcohol use: Yes    Alcohol/week: 1.0 standard drink of alcohol    Types: 1 Glasses of wine per week   Drug use: No   Sexual activity: Yes   Tobacco Counseling Counseling given: Not Answered  SDOH Screenings   Food Insecurity: No Food Insecurity (10/30/2024)  Housing: Unknown (10/30/2024)  Transportation Needs: No Transportation Needs (10/30/2024)  Utilities: Not At Risk (10/30/2024)  Alcohol Screen: Low Risk  (01/03/2024)  Depression (PHQ2-9): Low Risk  (10/30/2024)  Financial Resource Strain: Low Risk  (01/03/2024)  Physical Activity: Insufficiently Active (10/30/2024)  Social Connections: Socially Integrated (10/30/2024)  Stress: No Stress Concern Present (10/30/2024)  Tobacco Use: Low Risk  (10/30/2024)  Health Literacy: Adequate Health Literacy (10/30/2024)   Depression Screen    10/30/2024    2:44 PM 06/23/2024    1:52 PM 01/06/2024    1:49 PM 08/27/2023   11:22 AM 07/01/2023    4:33 PM 01/01/2023    2:19 PM 12/14/2021   10:11 AM  PHQ 2/9 Scores  PHQ - 2 Score 1 0 0 0 0 0 0  PHQ- 9 Score 1   1   2  2       Data saved with a  previous flowsheet row definition     Goals Addressed             This Visit's Progress    increase daily water intake.   On track    Keep a cup of water by my side and refill the cup often.       Visit info / Clinical Intake: Medicare Wellness Visit Type:: Subsequent Annual Wellness Visit Medicare Wellness Visit Mode:: In-person (required for WTM) Interpreter Needed?: No Pre-visit prep was completed: yes AWV questionnaire completed by patient prior to visit?: no Living arrangements:: lives with spouse/significant other Patient's Overall Health Status Rating: excellent Typical amount of pain: none Does pain affect daily life?: no Are you currently prescribed opioids?: no  Dietary Habits and Nutritional Risks How many meals a day?: 2 Eats fruit and vegetables daily?: yes Most meals are obtained by: preparing own meals Diabetic:: no  Functional Status Activities of Daily Living (to include ambulation/medication): Independent Ambulation: Independent with device- listed below Home Assistive Devices/Equipment: Eyeglasses Medication Administration: Independent Home Management: Independent Manage your own finances?: yes Primary transportation is: driving Concerns about vision?: no *vision screening is required for WTM* (wear eyeglasses/[place]/UTD-per pt)  Fall Screening Falls in the past year?: 0 Number of  falls in past year: 0 Was there an injury with Fall?: 0 Fall Risk Category Calculator: 0 Patient Fall Risk Level: Low Fall Risk  Fall Risk Patient at Risk for Falls Due to: No Fall Risks Fall risk Follow up: Falls evaluation completed; Falls prevention discussed  Home and Transportation Safety: All rugs have non-skid backing?: N/A, no rugs All stairs or steps have railings?: yes Grab bars in the bathtub or shower?: (!) no Have non-skid surface in bathtub or shower?: yes Good home lighting?: yes Regular seat belt use?: yes Hospital stays in the last year::  no  Cognitive Assessment Difficulty concentrating, remembering, or making decisions? : no Will 6CIT or Mini Cog be Completed: no 6CIT or Mini Cog Declined: patient alert, oriented, able to answer questions appropriately and recall recent events  Advance Directives (For Healthcare) Does Patient Have a Medical Advance Directive?: Yes Type of Advance Directive: Healthcare Power of [place]; Living will  Reviewed/Updated  Reviewed/Updated: All        Objective:    Today's Vitals   10/30/24 1436  BP: 120/72  Pulse: 77  SpO2: 99%  Weight: 139 lb 9.6 oz (63.3 kg)  Height: 5' 2.5 (1.588 m)   Body mass index is 25.13 kg/m.  Current Medications (verified) Outpatient Encounter Medications as of 10/30/2024  Medication Sig   Cholecalciferol (VITAMIN D3) 50 MCG (2000 UT) capsule Take 1 capsule (2,000 Units total) by mouth daily.   EPINEPHrine  0.3 mg/0.3 mL IJ SOAJ injection Inject 0.3 mg into the skin as needed for anaphylaxis.   estradiol (ESTRACE) 0.1 MG/GM vaginal cream Apply fingertip amount night for 2 weeks, then apply every other night indefinitely   losartan  (COZAAR ) 100 MG tablet Take 1 tablet (100 mg total) by mouth daily.   methylPREDNISolone  (MEDROL  DOSEPAK) 4 MG TBPK tablet As directed for insect bite reaction (Patient taking differently: Take 4 mg by mouth as needed. As directed for insect bite reaction)   zolpidem  (AMBIEN ) 10 MG tablet TAKE 1 TABLET BY MOUTH EVERY DAY AT BEDTIME AS NEEDED   ibuprofen (ADVIL) 600 MG tablet Take 600 mg by mouth every 6 (six) hours.   metoCLOPramide (REGLAN) 10 MG tablet Take 10 mg by mouth every 6 (six) hours as needed.   Vitamin D , Ergocalciferol , (DRISDOL ) 1.25 MG (50000 UNIT) CAPS capsule Take 1 capsule (50,000 Units total) by mouth every 7 (seven) days.   No facility-administered encounter medications on file as of 10/30/2024.   Hearing/Vision screen Hearing Screening - Comments:: Denies hearing difficulties   Vision Screening -  Comments:: wear eyeglasses/[place]/UTD-per p Immunizations and Health Maintenance Health Maintenance  Topic Date Due   Hepatitis C Screening  Never done   Mammogram  06/24/2018   DTaP/Tdap/Td (2 - Td or Tdap) 05/27/2021   COVID-19 Vaccine (7 - 2025-26 season) 08/24/2024   Medicare Annual Wellness (AWV)  10/30/2025   Pneumococcal Vaccine: 50+ Years  Completed   Influenza Vaccine  Completed   DEXA SCAN  Completed   Zoster Vaccines- Shingrix  Completed   Meningococcal B Vaccine  Aged Out   Colonoscopy  Discontinued        Assessment/Plan:  This is a routine wellness examination for [place].  Patient Care Team: Plotnikov, Karlynn GAILS, MD as PCP - General (Internal Medicine) Okey Arch, MD (Inactive) (Obstetrics and Gynecology) Luis Purchase, MD as Consulting Physician (Gastroenterology) Delmonte, Carliss ORN, MD as Consulting Physician (Ophthalmology)  I have personally reviewed and noted the following in the patient's chart:   Medical and social  history Use of alcohol, tobacco or illicit drugs  Current medications and supplements including opioid prescriptions. Functional ability and status Nutritional status Physical activity Advanced directives List of other physicians Hospitalizations, surgeries, and ER visits in previous 12 months Vitals Screenings to include cognitive, depression, and falls Referrals and appointments  No orders of the defined types were placed in this encounter.  In addition, I have reviewed and discussed with patient certain preventive protocols, quality metrics, and best practice recommendations. A written personalized care plan for preventive services as well as general preventive health recommendations were provided to patient.   Toni Hoffmeister L Latosha Gaylord, CMA   10/30/2024   Return in 1 year (on 10/30/2025).  After Visit Summary: (MyChart) Due to this being a telephonic visit, the after visit summary with patients personalized plan was offered to patient via  MyChart   Nurse Notes: Patient is due for a Hep C screening and can get that done during your next office visit.  Patient is also due for a Tdap. Patient stated that she has had a more recent colonoscopy and also a recent mammogram.  I have sent a request for the mammogram screening results, out today.  She had no other concerns to address today.

## 2024-10-30 NOTE — Patient Instructions (Addendum)
 Olivia Odom,  Thank you for taking the time for your Medicare Wellness Visit. I appreciate your continued commitment to your health goals. Please review the care plan we discussed, and feel free to reach out if I can assist you further.  Please note that Annual Wellness Visits do not include a physical exam. Some assessments may be limited, especially if the visit was conducted virtually. If needed, we may recommend an in-person follow-up with your provider.  Ongoing Care Seeing your primary care provider every 3 to 6 months helps us  monitor your health and provide consistent, personalized care. Last office visit on 06/23/2024.  You are due for a Hep C screening and can get that done during your next office visit.  You are also due for a Tetanus vaccine and can get that done at your local pharmacy.  Keep up the good work.    Referrals If a referral was made during today's visit and you haven't received any updates within two weeks, please contact the referred provider directly to check on the status.  Recommended Screenings:  Health Maintenance  Topic Date Due   Hepatitis C Screening  Never done   Breast Cancer Screening  06/24/2018   DTaP/Tdap/Td vaccine (2 - Td or Tdap) 05/27/2021   COVID-19 Vaccine (7 - 2025-26 season) 08/24/2024   Medicare Annual Wellness Visit  10/30/2025   Pneumococcal Vaccine for age over 42  Completed   Flu Shot  Completed   DEXA scan (bone density measurement)  Completed   Zoster (Shingles) Vaccine  Completed   Meningitis B Vaccine  Aged Out   Colon Cancer Screening  Discontinued       10/30/2024    2:39 PM  Advanced Directives  Does Patient Have a Medical Advance Directive? Yes  Type of Estate Agent of [place];Living will    Vision: Annual vision screenings are recommended for early detection of glaucoma, cataracts, and diabetic retinopathy. These exams can also reveal signs of chronic conditions such as diabetes and high blood  pressure.  Dental: Annual dental screenings help detect early signs of oral cancer, gum disease, and other conditions linked to overall health, including heart disease and diabetes.  Please see the attached documents for additional preventive care recommendations.

## 2024-12-06 DIAGNOSIS — Z23 Encounter for immunization: Secondary | ICD-10-CM | POA: Diagnosis not present

## 2024-12-10 ENCOUNTER — Ambulatory Visit (INDEPENDENT_AMBULATORY_CARE_PROVIDER_SITE_OTHER): Admitting: Internal Medicine

## 2024-12-10 ENCOUNTER — Encounter: Payer: Self-pay | Admitting: Internal Medicine

## 2024-12-10 VITALS — BP 124/86 | HR 100 | Temp 97.9°F | Ht 62.5 in | Wt 141.6 lb

## 2024-12-10 DIAGNOSIS — I10 Essential (primary) hypertension: Secondary | ICD-10-CM | POA: Diagnosis not present

## 2024-12-10 DIAGNOSIS — G4700 Insomnia, unspecified: Secondary | ICD-10-CM | POA: Diagnosis not present

## 2024-12-10 DIAGNOSIS — E785 Hyperlipidemia, unspecified: Secondary | ICD-10-CM | POA: Diagnosis not present

## 2024-12-10 DIAGNOSIS — R739 Hyperglycemia, unspecified: Secondary | ICD-10-CM | POA: Diagnosis not present

## 2024-12-10 DIAGNOSIS — E559 Vitamin D deficiency, unspecified: Secondary | ICD-10-CM

## 2024-12-10 DIAGNOSIS — N1831 Chronic kidney disease, stage 3a: Secondary | ICD-10-CM | POA: Diagnosis not present

## 2024-12-10 MED ORDER — ZOLPIDEM TARTRATE 10 MG PO TABS: ORAL_TABLET | ORAL | 1 refills | Status: AC

## 2024-12-10 NOTE — Assessment & Plan Note (Signed)
Monitoring GFR °Good hydration °

## 2024-12-10 NOTE — Assessment & Plan Note (Signed)
Coronary calcium score of 0. 

## 2024-12-10 NOTE — Assessment & Plan Note (Signed)
Losartan -  100 mg/d 2024

## 2024-12-10 NOTE — Assessment & Plan Note (Signed)
 Continue with vitamin D

## 2024-12-10 NOTE — Progress Notes (Signed)
 Subjective:  Patient ID: Olivia Odom, female    DOB: [birth year]  Age: 78 y.o. MRN: [id number]  CC: Medical Management of Chronic Issues (Blood pressure concerns, prescriptions, and labs)   HPI [name] presents for HTN, CRI, Zolpidem   Outpatient Medications Prior to Visit  Medication Sig Dispense Refill   Cholecalciferol (VITAMIN D3) 50 MCG (2000 UT) capsule Take 1 capsule (2,000 Units total) by mouth daily. 100 capsule 3   EPINEPHrine  0.3 mg/0.3 mL IJ SOAJ injection Inject 0.3 mg into the skin as needed for anaphylaxis. 1 each 3   estradiol (ESTRACE) 0.1 MG/GM vaginal cream Apply fingertip amount night for 2 weeks, then apply every other night indefinitely     losartan  (COZAAR ) 100 MG tablet Take 1 tablet (100 mg total) by mouth daily. 90 tablet 3   methylPREDNISolone  (MEDROL  DOSEPAK) 4 MG TBPK tablet As directed for insect bite reaction (Patient taking differently: Take 4 mg by mouth as needed. As directed for insect bite reaction) 21 tablet 1   zolpidem  (AMBIEN ) 10 MG tablet TAKE 1 TABLET BY MOUTH EVERY DAY AT BEDTIME AS NEEDED 90 tablet 1   ibuprofen (ADVIL) 600 MG tablet Take 600 mg by mouth every 6 (six) hours.     metoCLOPramide (REGLAN) 10 MG tablet Take 10 mg by mouth every 6 (six) hours as needed.     Vitamin D , Ergocalciferol , (DRISDOL ) 1.25 MG (50000 UNIT) CAPS capsule Take 1 capsule (50,000 Units total) by mouth every 7 (seven) days. 8 capsule 0   No facility-administered medications prior to visit.    ROS: Review of Systems  Constitutional:  Negative for activity change, appetite change, chills, fatigue and unexpected weight change.  HENT:  Negative for congestion, mouth sores and sinus pressure.   Eyes:  Negative for visual disturbance.  Respiratory:  Negative for cough and chest tightness.   Gastrointestinal:  Negative for abdominal pain and nausea.  Genitourinary:  Negative for difficulty urinating, frequency and vaginal pain.  Musculoskeletal:  Negative  for arthralgias, back pain and gait problem.  Skin:  Negative for pallor and rash.  Neurological:  Negative for dizziness, tremors, weakness, numbness and headaches.  Psychiatric/Behavioral:  Negative for confusion, sleep disturbance and suicidal ideas. The patient is not nervous/anxious.     Objective:  BP 124/86   Pulse 100   Temp 97.9 F (36.6 C)   Ht 5' 2.5 (1.588 m)   Wt 141 lb 9.6 oz (64.2 kg)   SpO2 99%   BMI 25.49 kg/m   BP Readings from Last 3 Encounters:  12/10/24 124/86  10/30/24 120/72  06/23/24 118/78    Wt Readings from Last 3 Encounters:  12/10/24 141 lb 9.6 oz (64.2 kg)  10/30/24 139 lb 9.6 oz (63.3 kg)  06/23/24 141 lb (64 kg)    Physical Exam Constitutional:      General: She is not in acute distress.    Appearance: She is well-developed.  HENT:     Head: Normocephalic.     Right Ear: External ear normal.     Left Ear: External ear normal.     Nose: Nose normal.  Eyes:     General:        Right eye: No discharge.        Left eye: No discharge.     Conjunctiva/sclera: Conjunctivae normal.     Pupils: Pupils are equal, round, and reactive to light.  Neck:     Thyroid : No thyromegaly.     Vascular:  No JVD.     Trachea: No tracheal deviation.  Cardiovascular:     Rate and Rhythm: Normal rate and regular rhythm.     Heart sounds: Normal heart sounds.  Pulmonary:     Effort: No respiratory distress.     Breath sounds: No stridor. No wheezing.  Abdominal:     General: Bowel sounds are normal. There is no distension.     Palpations: Abdomen is soft. There is no mass.     Tenderness: There is no abdominal tenderness. There is no guarding or rebound.  Musculoskeletal:        General: No tenderness.     Cervical back: Normal range of motion and neck supple. No rigidity.  Lymphadenopathy:     Cervical: No cervical adenopathy.  Skin:    Findings: No erythema or rash.  Neurological:     Mental Status: She is oriented to person, place, and time.      Cranial Nerves: No cranial nerve deficit.     Motor: No abnormal muscle tone.     Coordination: Coordination normal.     Gait: Gait normal.     Deep Tendon Reflexes: Reflexes normal.  Psychiatric:        Behavior: Behavior normal.        Thought Content: Thought content normal.        Judgment: Judgment normal.     Lab Results  Component Value Date   WBC 4.7 01/07/2024   HGB 14.7 01/07/2024   HCT 43.0 01/07/2024   PLT 223.0 01/07/2024   GLUCOSE 107 (H) 06/29/2024   CHOL 257 (H) 01/07/2024   TRIG 79.0 01/07/2024   HDL 75.20 01/07/2024   LDLDIRECT 160.7 01/09/2012   LDLCALC 166 (H) 01/07/2024   ALT 20 06/29/2024   AST 20 06/29/2024   NA 136 06/29/2024   K 4.3 06/29/2024   CL 100 06/29/2024   CREATININE 0.94 06/29/2024   BUN 11 06/29/2024   CO2 28 06/29/2024   TSH 1.84 01/07/2024   HGBA1C 5.6 06/21/2022    MM LT BREAST BX W LOC DEV 1ST LESION IMAGE BX SPEC STEREO GUIDE Addendum Date: 10/22/2024 ADDENDUM REPORT: 10/22/2024 15:45 ADDENDUM: PATHOLOGY revealed: Breast, left, needle core biopsy, Upper inner ( x clip)- STROMAL FIBROSIS AND A FEW BENIGN CYSTS. NEGATIVE FOR ATYPIA AND MALIGNANCY. Pathology results are CONCORDANT with imaging findings, per Dr. Curtistine Noble. Pathology results and recommendations were discussed with patient via telephone on 10/22/2024 by Mliss Molt RN. Patient reported biopsy site doing well with no adverse symptoms, and only slight tenderness at the site. Post biopsy care instructions were reviewed, questions were answered and my direct phone number was provided. Patient was instructed to call [facility] for any additional questions or concerns related to biopsy site. RECOMMENDATION: Patient instructed to resume annual bilateral screening mammogram when due. Pathology results reported by Mliss Molt RN 10/22/2024. Electronically Signed   By: Curtistine Noble   On: 10/22/2024 15:45   Result Date: 10/22/2024 CLINICAL DATA:   78 year old female with subtle left breast architectural distortion about of the upper inner quadrant, middle to posterior third. EXAM: LEFT BREAST STEREOTACTIC CORE NEEDLE BIOPSY COMPARISON:  Previous exam(s). FINDINGS: The patient and I discussed the procedure of stereotactic-guided biopsy including benefits and alternatives. We discussed the high likelihood of a successful procedure. We discussed the risks of the procedure including infection, bleeding, tissue injury, clip migration, and inadequate sampling. Informed written consent was given. The usual time out protocol was  performed immediately prior to the procedure. Using sterile technique and 1% Lidocaine  with and without epinephrine  as local anesthetic, under stereotactic guidance, a 9 gauge vacuum assisted device was used to perform core needle biopsy of subtle architectural distortion about the upper-inner breast, middle to posterior third using a superior approach. Specimens with calcifications are identified for pathology. Lesion quadrant: Upper inner quadrant At the conclusion of the procedure, an X shaped tissue marker clip was deployed into the biopsy cavity. Follow-up 2-view mammogram was performed and dictated separately. IMPRESSION: Stereotactic-guided biopsy of the left breast as above. No apparent complications. Electronically Signed: By: Curtistine Noble On: 10/21/2024 13:34   MM CLIP PLACEMENT LEFT Result Date: 10/21/2024 CLINICAL DATA:  78 year old female status post stereotactic biopsy of the left breast. EXAM: 3D DIAGNOSTIC LEFT MAMMOGRAM POST STEREOTACTIC BIOPSY COMPARISON:  Previous exam(s). ACR Breast Density Category c: The breasts are heterogeneously dense, which may obscure small masses. FINDINGS: 3D Mammographic images were obtained following stereotactic guided biopsy of left breast architectural distortion about the upper-inner quadrant. The biopsy marking clip is in expected position at the site of biopsy. IMPRESSION:  Appropriate positioning of the X shaped biopsy marking clip at the site of biopsy in the upper inner left breast. Final Assessment: Post Procedure Mammograms for Marker Placement Electronically Signed   By: Curtistine Noble   On: 10/21/2024 13:34    Assessment & Plan:   Problem List Items Addressed This Visit     Chronic kidney disease (CKD) stage G3a/A1, moderately decreased glomerular filtration rate (GFR) between 45-59 mL/min/1.73 square meter and albuminuria creatinine ratio less than 30 mg/g (HCC)   Monitoring GFR Good hydration      Relevant Orders   Comprehensive metabolic panel with GFR   Lipid panel   TSH   CULTURE, URINE COMPREHENSIVE   Dyslipidemia   Coronary calcium score of 0       Relevant Orders   Comprehensive metabolic panel with GFR   Lipid panel   TSH   CULTURE, URINE COMPREHENSIVE   HTN (hypertension) - Primary   Losartan  -  100 mg/d 2024      Relevant Orders   Comprehensive metabolic panel with GFR   Lipid panel   TSH   CULTURE, URINE COMPREHENSIVE   Hyperglycemia   Monitoring A1c      Relevant Orders   Hemoglobin A1c   Insomnia   Chronic, recurrent.  Primarily related to travel Zolpidem  prn  Potential benefits of a long term benzodiazepines  use as well as potential risks  and complications were explained to the patient and were aknowledged.      Vitamin D  deficiency   Continue with vitamin D          Meds ordered this encounter  Medications   zolpidem  (AMBIEN ) 10 MG tablet    Sig: TAKE 1 TABLET BY MOUTH EVERY DAY AT BEDTIME AS NEEDED    Dispense:  90 tablet    Refill:  1    OK to fill up to 5 days early to accommodate for travel      Follow-up: Return in about 6 months (around 06/10/2025) for Wellness Exam.  Marolyn Noel, MD

## 2024-12-10 NOTE — Assessment & Plan Note (Signed)
Chronic, recurrent.  Primarily related to travel Zolpidem prn  Potential benefits of a long term benzodiazepines  use as well as potential risks  and complications were explained to the patient and were aknowledged.

## 2024-12-10 NOTE — Assessment & Plan Note (Signed)
Monitoring A1c 

## 2024-12-14 LAB — COMPREHENSIVE METABOLIC PANEL WITH GFR
ALT: 24 U/L (ref 3–35)
AST: 23 U/L (ref 5–37)
Albumin: 4.6 g/dL (ref 3.5–5.2)
Alkaline Phosphatase: 74 U/L (ref 39–117)
BUN: 13 mg/dL (ref 6–23)
CO2: 24 meq/L (ref 19–32)
Calcium: 9.4 mg/dL (ref 8.4–10.5)
Chloride: 100 meq/L (ref 96–112)
Creatinine, Ser: 0.86 mg/dL (ref 0.40–1.20)
GFR: 65.62 mL/min
Glucose, Bld: 105 mg/dL — ABNORMAL HIGH (ref 70–99)
Potassium: 4.3 meq/L (ref 3.5–5.1)
Sodium: 136 meq/L (ref 135–145)
Total Bilirubin: 0.5 mg/dL (ref 0.2–1.2)
Total Protein: 7.5 g/dL (ref 6.0–8.3)

## 2024-12-14 LAB — TSH: TSH: 0.96 u[IU]/mL (ref 0.35–5.50)

## 2024-12-14 LAB — LIPID PANEL
Cholesterol: 302 mg/dL — ABNORMAL HIGH (ref 28–200)
HDL: 86 mg/dL
LDL Cholesterol: 204 mg/dL — ABNORMAL HIGH (ref 10–99)
NonHDL: 215.85
Total CHOL/HDL Ratio: 4
Triglycerides: 60 mg/dL (ref 10.0–149.0)
VLDL: 12 mg/dL (ref 0.0–40.0)

## 2024-12-14 LAB — HEMOGLOBIN A1C: Hgb A1c MFr Bld: 5.6 % (ref 4.6–6.5)

## 2024-12-16 LAB — CULTURE, URINE COMPREHENSIVE

## 2024-12-21 ENCOUNTER — Ambulatory Visit: Payer: Self-pay | Admitting: Internal Medicine

## 2024-12-21 DIAGNOSIS — E785 Hyperlipidemia, unspecified: Secondary | ICD-10-CM

## 2024-12-21 DIAGNOSIS — N1831 Chronic kidney disease, stage 3a: Secondary | ICD-10-CM

## 2025-01-01 ENCOUNTER — Ambulatory Visit: Admitting: Podiatry

## 2025-01-04 ENCOUNTER — Other Ambulatory Visit: Payer: Self-pay | Admitting: Internal Medicine

## 2025-01-04 DIAGNOSIS — E785 Hyperlipidemia, unspecified: Secondary | ICD-10-CM

## 2025-01-04 MED ORDER — ROSUVASTATIN CALCIUM 10 MG PO TABS: 10.0000 mg | ORAL_TABLET | Freq: Every day | ORAL | 3 refills | Status: AC

## 2025-01-21 ENCOUNTER — Ambulatory Visit: Admitting: Internal Medicine

## 2025-01-22 ENCOUNTER — Ambulatory Visit

## 2025-01-22 ENCOUNTER — Encounter: Payer: Self-pay | Admitting: Podiatry

## 2025-01-22 ENCOUNTER — Ambulatory Visit: Admitting: Podiatry

## 2025-01-22 DIAGNOSIS — D2371 Other benign neoplasm of skin of right lower limb, including hip: Secondary | ICD-10-CM | POA: Diagnosis not present

## 2025-01-22 DIAGNOSIS — S90851A Superficial foreign body, right foot, initial encounter: Secondary | ICD-10-CM | POA: Diagnosis not present

## 2025-01-22 DIAGNOSIS — B070 Plantar wart: Secondary | ICD-10-CM

## 2025-01-22 MED ORDER — FLUOROURACIL 5 % EX CREA: TOPICAL_CREAM | Freq: Two times a day (BID) | CUTANEOUS | 2 refills | Status: AC

## 2025-01-22 NOTE — Progress Notes (Signed)
 Subjective:   Patient ID: Olivia Odom, female   DOB: 79 y.o.   MRN: [id number]   HPI Patient presents with painful lesion plantar aspect right foot and states it feels like a palpable or something she may have stepped on.  Does not remember specific injury but it has been present for several months.  Patient is quite active does not smoke   Review of Systems  All other systems reviewed and are negative.       Objective:  Physical Exam Vitals and nursing note reviewed.  Constitutional:      Appearance: She is well-developed.  Pulmonary:     Effort: Pulmonary effort is normal.  Musculoskeletal:        General: Normal range of motion.  Skin:    General: Skin is warm.  Neurological:     Mental Status: She is alert.     Neurovascular status intact muscle strength found to be adequate range of motion within normal limits.  Patient is noted plantar right foot proximal to the metatarsal heads to have a keratotic lesion that upon debridement shows pinpoint bleeding pain to lateral pressure.  Good digital perfusion well-oriented x 3     Assessment:  Probability for verruca plantaris plantar right with possibility for porokeratosis or other type of lesion     Plan:  H&P condition reviewed.  Today I went ahead I debrided the lesion slight bleeding consistent with verruca plantaris applied chemical to create reactivity and immune response applied sterile dressing explained what to do if blistering were to occur wrote prescription for Efudex  reappoint to recheck with all questions answered today  X-rays dated today indicated no signs of a foreign body and lesion to be proximal to the metatarsal head third

## 2025-02-19 ENCOUNTER — Ambulatory Visit: Admitting: Podiatry
